# Patient Record
Sex: Female | Born: 1970 | Race: Black or African American | Hispanic: No | Smoking: Never smoker
Health system: Southern US, Community
[De-identification: ages and names within clinical notes are randomized; demographics above are authoritative.]

## PROBLEM LIST (undated history)

## (undated) DIAGNOSIS — N761 Subacute and chronic vaginitis: Secondary | ICD-10-CM

## (undated) DIAGNOSIS — E559 Vitamin D deficiency, unspecified: Secondary | ICD-10-CM

## (undated) DIAGNOSIS — E538 Deficiency of other specified B group vitamins: Secondary | ICD-10-CM

## (undated) DIAGNOSIS — L83 Acanthosis nigricans: Secondary | ICD-10-CM

## (undated) DIAGNOSIS — E669 Obesity, unspecified: Secondary | ICD-10-CM

## (undated) DIAGNOSIS — G35 Multiple sclerosis: Secondary | ICD-10-CM

## (undated) HISTORY — DX: Acanthosis nigricans: L83

## (undated) HISTORY — DX: Subacute and chronic vaginitis: N76.1

## (undated) HISTORY — DX: Deficiency of other specified B group vitamins: E53.8

## (undated) HISTORY — DX: Vitamin D deficiency, unspecified: E55.9

## (undated) HISTORY — DX: Obesity, unspecified: E66.9

## (undated) HISTORY — DX: Multiple sclerosis: G35

---

## 2007-10-10 ENCOUNTER — Emergency Department: Payer: Self-pay | Admitting: Emergency Medicine

## 2011-07-05 ENCOUNTER — Emergency Department: Payer: Self-pay | Admitting: Emergency Medicine

## 2013-07-02 ENCOUNTER — Ambulatory Visit: Payer: Self-pay | Admitting: Family Medicine

## 2013-08-28 ENCOUNTER — Ambulatory Visit: Payer: Self-pay | Admitting: Neurology

## 2013-08-28 LAB — HCG, QUANTITATIVE, PREGNANCY: Beta Hcg, Quant.: <1 m[IU]/mL — ABNORMAL LOW

## 2013-09-04 ENCOUNTER — Ambulatory Visit: Payer: Self-pay | Admitting: Neurology

## 2013-10-02 ENCOUNTER — Ambulatory Visit: Payer: Self-pay | Admitting: Neurology

## 2013-10-02 LAB — ALBUMIN: Albumin: 3.7 g/dL (ref 3.4–5.0)

## 2013-10-02 LAB — CSF CELL COUNT WITH DIFFERENTIAL
CSF Tube #: 1
CSF Tube #: 4
Lymphocytes: 89 %
Lymphocytes: 89 %
Monocytes/Macrophages: 11 %
Monocytes/Macrophages: 11 %
Neutrophils: 0 %
Neutrophils: 0 %
Other Cells: 0 %
Other Cells: 0 %
RBC (CSF): 0 /mm3
WBC (CSF): 5 /mm3
WBC (CSF): 8 /mm3

## 2013-10-02 LAB — APTT: Activated PTT: 26.4 secs (ref 23.6–35.9)

## 2013-10-02 LAB — PROTIME-INR
INR: 0.8
Prothrombin Time: 11.6 secs (ref 11.5–14.7)

## 2013-10-02 LAB — GLUCOSE, CSF: Glucose, CSF: 57 mg/dL (ref 40–75)

## 2013-10-02 LAB — PROTEIN, CSF: Protein, CSF: 52 mg/dL — ABNORMAL HIGH (ref 15–45)

## 2013-10-05 LAB — CSF CULTURE

## 2013-10-11 ENCOUNTER — Ambulatory Visit: Payer: Self-pay | Admitting: Neurology

## 2014-01-01 IMAGING — RF LUMBAR PUNCTURE FLUORO GUIDE
1 series · 2 of 2 positions shown · non-contrast
Comparison: none

REASON FOR EXAM: Neck Pain Numbness Tingling
COMMENTS:

PROCEDURE:     FL  - FL  GUIDED LUMBAR PUNCTURE  - October 02, 2013  [DATE]
RESULT:

[Series 1: fluoro_iodine 2fps_bw · 0.17mm/px · 2 of 2 frames shown]
[frame 1/2]
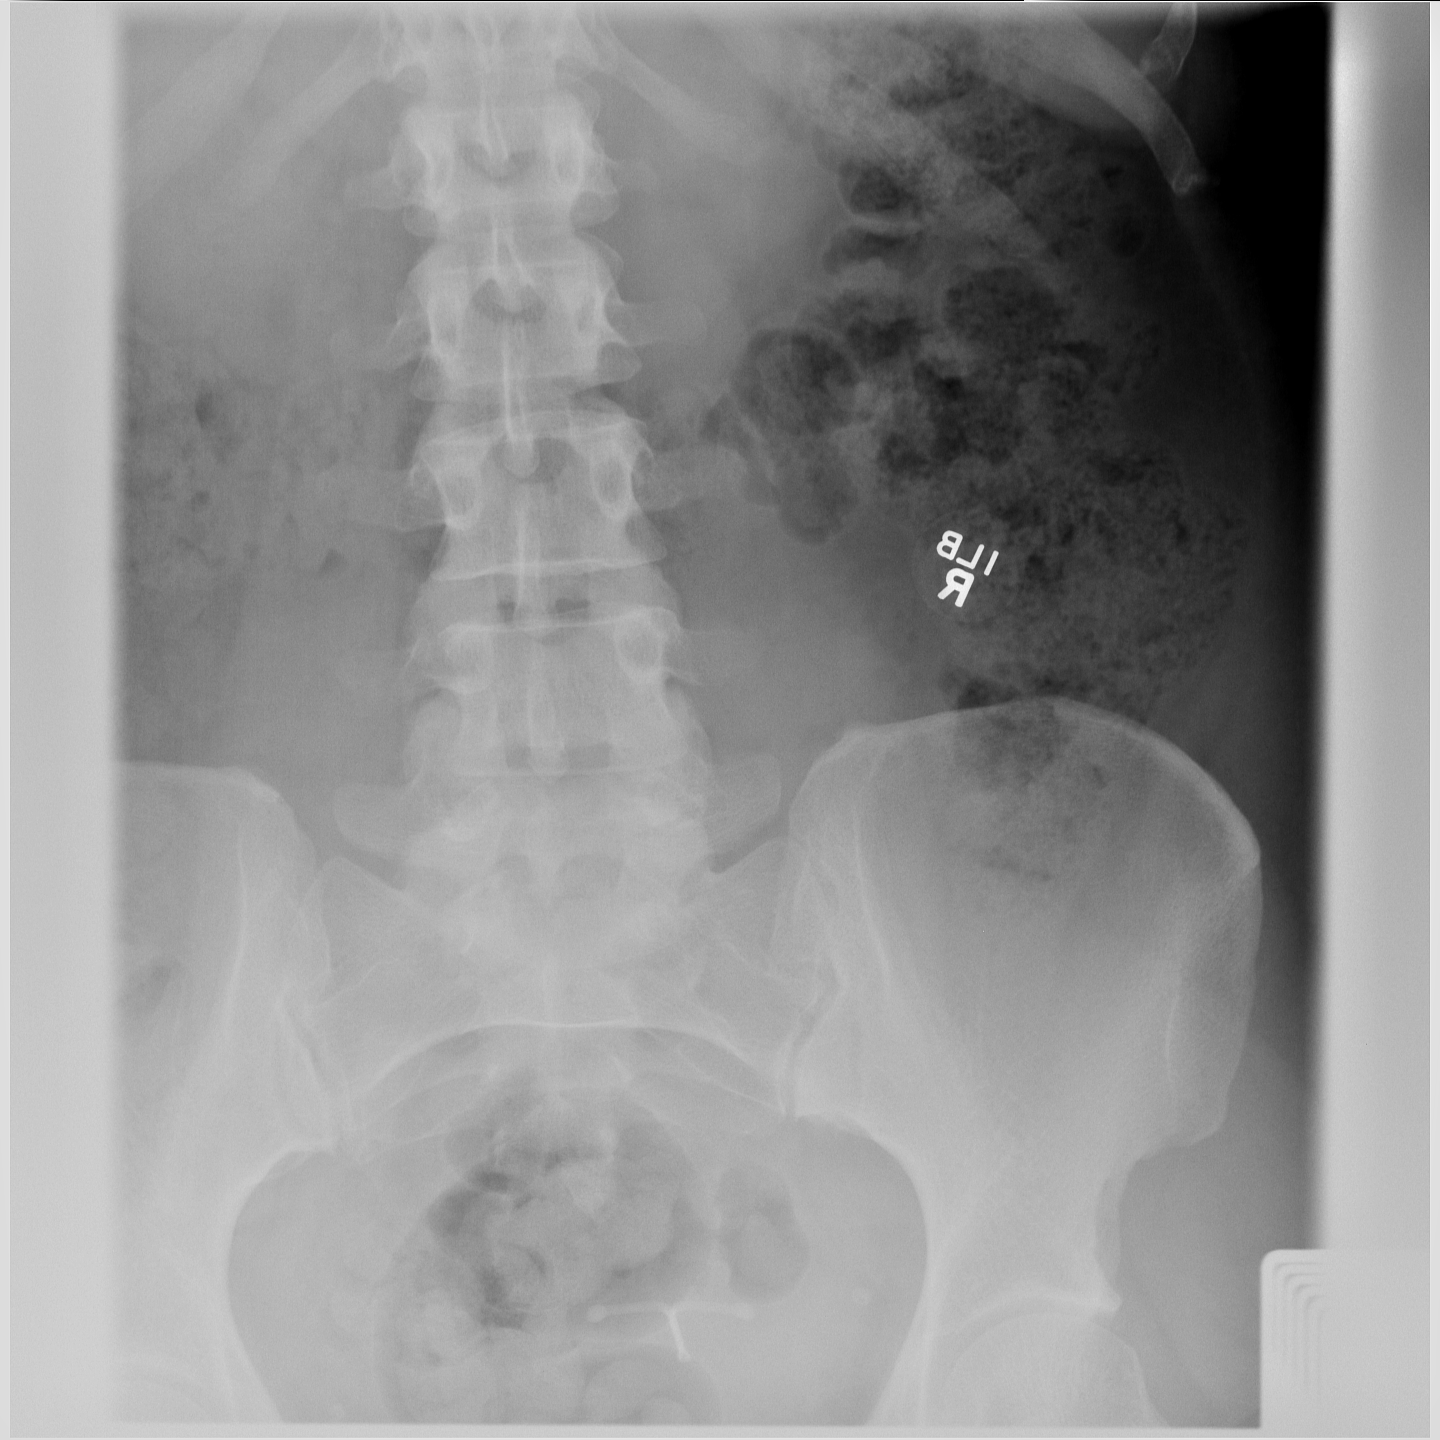
[frame 2/2]
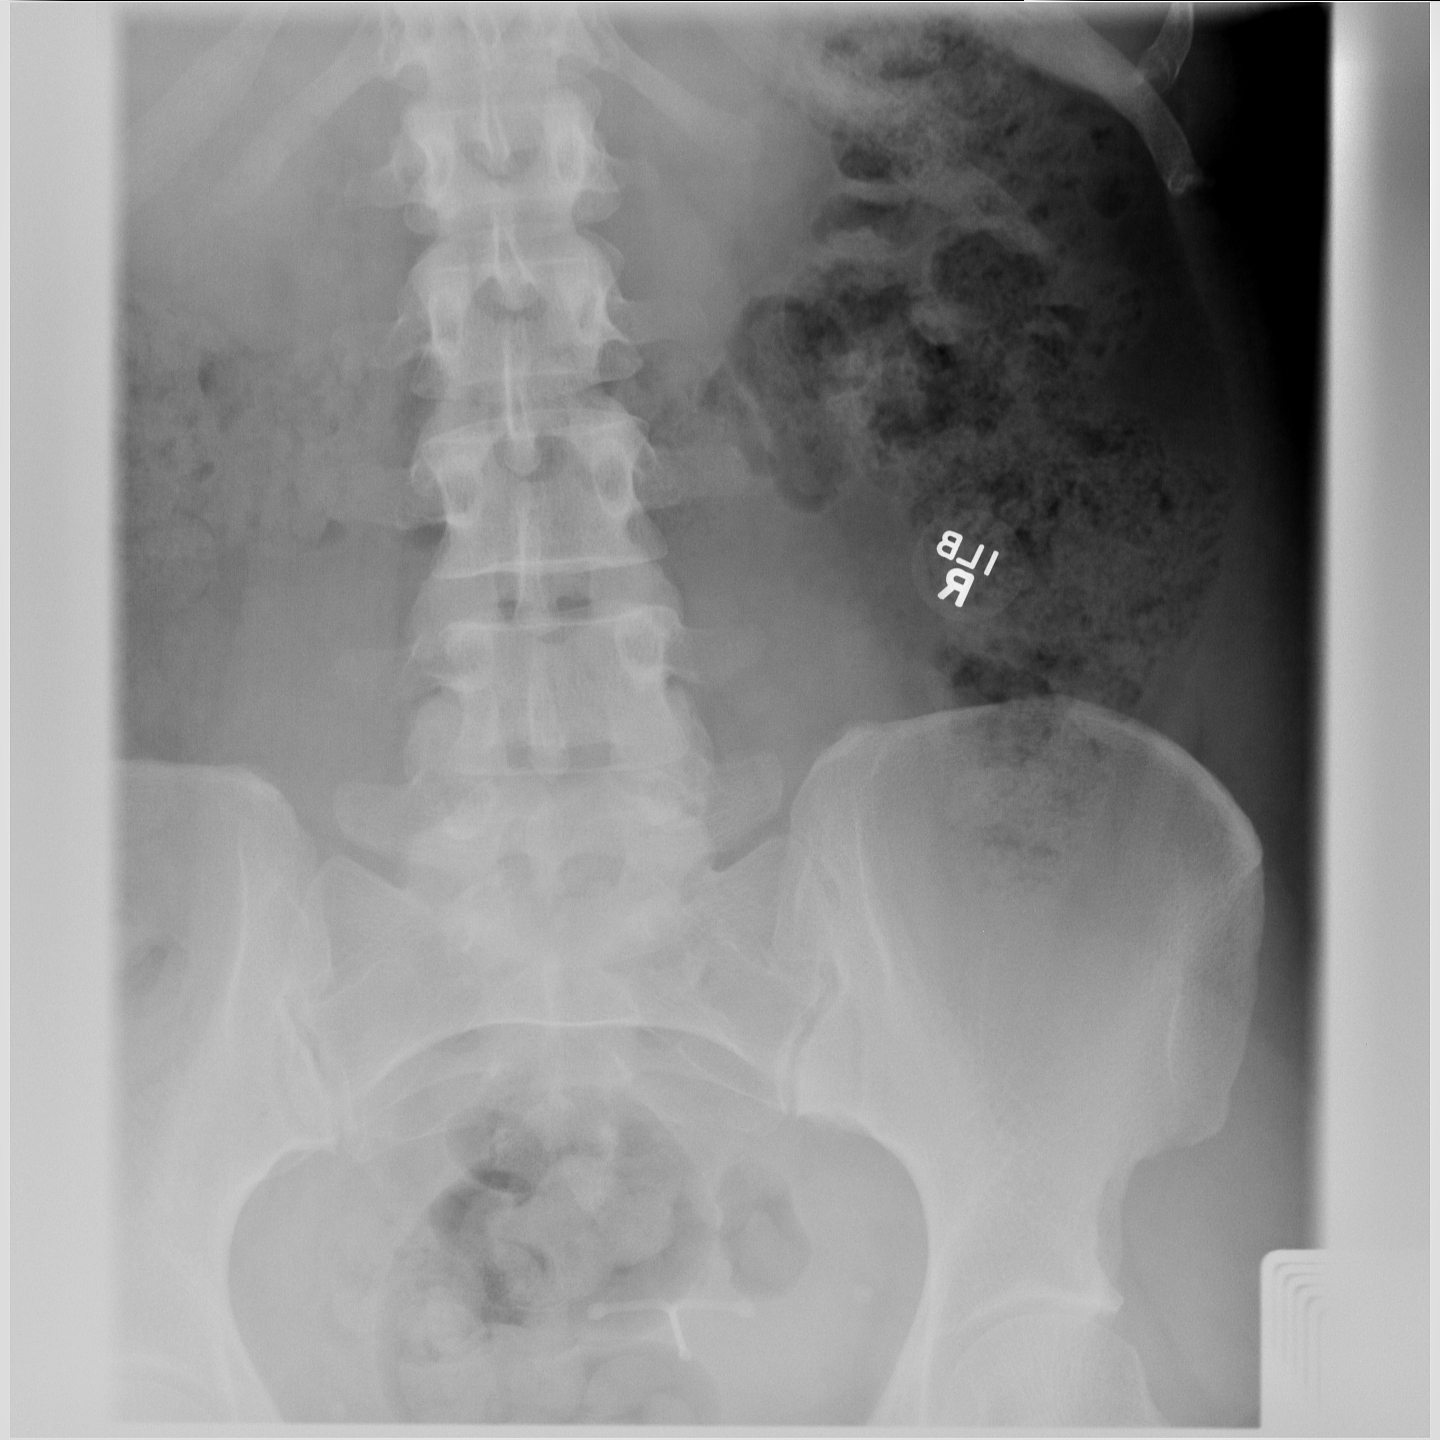

[2 of 2 positions shown; findings below may reference images not displayed]

FINDINGS: The patient was informed of the risks and benefits of the
procedure and proper informed consent was obtained. The patient was brought
to the fluoroscopy suite and placed in a prone position. The lumbar spine
was evaluated and proper entry site for thecal sac cannulation was
established at the L4-5 level. The overlying soft tissues were then prepped
and draped in the usual sterile fashion. Utilizing 4 ml of 1% Xylocaine
without epinephrine, the overlying soft tissues were anesthetized. Under
fluoroscopic guidance, the thecal sac was cannulated with a half-inch
inch/22 gauge spinal needle. Clear CSF is appreciated within the hub of the
needle. Approximately 10 ml of clear CSF was removed from the thecal sac.
The needle was removed and the patient tolerated the procedure without
complications.
IMPRESSION: Fluoroscopically guided lumbar puncture as described above.

## 2014-10-02 ENCOUNTER — Ambulatory Visit: Payer: Self-pay | Admitting: Family Medicine

## 2015-04-02 DIAGNOSIS — Z975 Presence of (intrauterine) contraceptive device: Secondary | ICD-10-CM | POA: Insufficient documentation

## 2015-06-03 ENCOUNTER — Encounter: Payer: Self-pay | Admitting: Family Medicine

## 2015-12-23 ENCOUNTER — Ambulatory Visit (INDEPENDENT_AMBULATORY_CARE_PROVIDER_SITE_OTHER): Payer: BLUE CROSS/BLUE SHIELD | Admitting: Family Medicine

## 2015-12-23 ENCOUNTER — Ambulatory Visit
Admission: RE | Admit: 2015-12-23 | Discharge: 2015-12-23 | Disposition: A | Discharge status: Home / Self Care | Payer: BLUE CROSS/BLUE SHIELD | Source: Ambulatory Visit | Attending: Family Medicine | Admitting: Family Medicine

## 2015-12-23 ENCOUNTER — Encounter: Payer: Self-pay | Admitting: Family Medicine

## 2015-12-23 VITALS — BP 114/62 | HR 86 | Temp 98.3°F | Resp 16 | Ht 65.0 in | Wt 212.0 lb

## 2015-12-23 DIAGNOSIS — J40 Bronchitis, not specified as acute or chronic: Secondary | ICD-10-CM | POA: Diagnosis not present

## 2015-12-23 DIAGNOSIS — R0981 Nasal congestion: Secondary | ICD-10-CM | POA: Insufficient documentation

## 2015-12-23 DIAGNOSIS — J209 Acute bronchitis, unspecified: Secondary | ICD-10-CM

## 2015-12-23 DIAGNOSIS — R05 Cough: Secondary | ICD-10-CM | POA: Diagnosis present

## 2015-12-23 DIAGNOSIS — E538 Deficiency of other specified B group vitamins: Secondary | ICD-10-CM | POA: Insufficient documentation

## 2015-12-23 DIAGNOSIS — J8410 Pulmonary fibrosis, unspecified: Secondary | ICD-10-CM | POA: Insufficient documentation

## 2015-12-23 MED ORDER — HYDROCOD POLST-CPM POLST ER 10-8 MG/5ML PO SUER: 5.0000 mL | Freq: Two times a day (BID) | ORAL | Status: DC | PRN

## 2015-12-23 MED ORDER — BENZONATATE 200 MG PO CAPS: 200.0000 mg | ORAL_CAPSULE | Freq: Three times a day (TID) | ORAL | Status: DC | PRN

## 2015-12-23 MED ORDER — AMOXICILLIN-POT CLAVULANATE 875-125 MG PO TABS: 1.0000 | ORAL_TABLET | Freq: Two times a day (BID) | ORAL | Status: DC

## 2015-12-23 NOTE — Patient Instructions (Signed)
1) Chest X-ray across the street at [place]  Acute Bronchitis Bronchitis is inflammation of the airways that extend from the windpipe into the lungs (bronchi). The inflammation often causes mucus to develop. This leads to a cough, which is the most common symptom of bronchitis.  In acute bronchitis, the condition usually develops suddenly and goes away over time, usually in a couple weeks. Smoking, allergies, and asthma can make bronchitis worse. Repeated episodes of bronchitis may cause further lung problems.  CAUSES Acute bronchitis is most often caused by the same virus that causes a cold. The virus can spread from person to person (contagious) through coughing, sneezing, and touching contaminated objects. SIGNS AND SYMPTOMS   Cough.   Fever.   Coughing up mucus.   Body aches.   Chest congestion.   Chills.   Shortness of breath.   Sore throat.  DIAGNOSIS  Acute bronchitis is usually diagnosed through a physical exam. Your health care provider will also ask you questions about your medical history. Tests, such as chest X-rays, are sometimes done to rule out other conditions.  TREATMENT  Acute bronchitis usually goes away in a couple weeks. Oftentimes, no medical treatment is necessary. Medicines are sometimes given for relief of fever or cough. Antibiotic medicines are usually not needed but may be prescribed in certain situations. In some cases, an inhaler may be recommended to help reduce shortness of breath and control the cough. A cool mist vaporizer may also be used to help thin bronchial secretions and make it easier to clear the chest.  HOME CARE INSTRUCTIONS  Get plenty of rest.   Drink enough fluids to keep your urine clear or pale yellow (unless you have a medical condition that requires fluid restriction). Increasing fluids may help thin your respiratory secretions (sputum) and reduce chest congestion, and it will prevent dehydration.   Take  medicines only as directed by your health care provider.  If you were prescribed an antibiotic medicine, finish it all even if you start to feel better.  Avoid smoking and secondhand smoke. Exposure to cigarette smoke or irritating chemicals will make bronchitis worse. If you are a smoker, consider using nicotine gum or skin patches to help control withdrawal symptoms. Quitting smoking will help your lungs heal faster.   Reduce the chances of another bout of acute bronchitis by washing your hands frequently, avoiding people with cold symptoms, and trying not to touch your hands to your mouth, nose, or eyes.   Keep all follow-up visits as directed by your health care provider.  SEEK MEDICAL CARE IF: Your symptoms do not improve after 1 week of treatment.  SEEK IMMEDIATE MEDICAL CARE IF:  You develop an increased fever or chills.   You have chest pain.   You have severe shortness of breath.  You have bloody sputum.   You develop dehydration.  You faint or repeatedly feel like you are going to pass out.  You develop repeated vomiting.  You develop a severe headache. MAKE SURE YOU:   Understand these instructions.  Will watch your condition.  Will get help right away if you are not doing well or get worse.   This information is not intended to replace advice given to you by your health care provider. Make sure you discuss any questions you have with your health care provider.   Document Released: 01/12/2005 Document Revised: 12/26/2014 Document Reviewed: 05/28/2013 Elsevier Interactive Patient Education [employer].

## 2015-12-23 NOTE — Progress Notes (Signed)
Name: Hannah Terry   MRN: [id number]    DOB: [birth year]   Date:12/23/2015       Progress Note  Subjective  Chief Complaint  Chief Complaint  Patient presents with  . URI    HPI  Patient is here today with concerns regarding the following symptoms sore throat, post nasal drip, sinus pressure and non productive cough which is causing a crick in her neck, chest tightness, hoarseness and headache that started 2 weeks ago. Not associated with fever. Has tried the following home remedies: Tylenol sinus and otc sinis meds. Positive sick contact her husband who was diagnosed with PNA.    Past Medical History  Diagnosis Date  . B12 deficiency     Social History  Substance Use Topics  . Smoking status: Never Smoker   . Smokeless tobacco: Not on file  . Alcohol Use: Not on file    No current outpatient prescriptions on file.  No Known Allergies  ROS  Positive for fatigue, nasal congestion, sinus pressure, ear fullness, cough as mentioned in HPI, otherwise all systems reviewed and are negative.  Objective  Filed Vitals:   12/23/15 1335  BP: 114/62  Pulse: 86  Temp: 98.3 F (36.8 C)  TempSrc: Oral  Resp: 16  Height: 5\' 5"  (1.651 m)  Weight: 212 lb (96.163 kg)  SpO2: 96%   Body mass index is 35.28 kg/(m^2).   Physical Exam  Constitutional: Patient appears well-developed and well-nourished. In no acute distress but does appear to be fatigued from acute illness. HEENT:  - Head: Normocephalic and atraumatic.  - Ears: RIGHT TM bulging with minimal clear exudate, LEFT TM bulging with minimal clear exudate.  - Nose: Nasal mucosa boggy and congested.  - Mouth/Throat: Oropharynx is moist with slight erythema of bilateral tonsils without hypertrophy or exudates. Post nasal drainage present.  - Eyes: Conjunctivae clear, EOM movements normal. PERRLA. No scleral icterus.  Neck: Normal range of motion. Neck supple. No JVD present. No thyromegaly present. No local  lymphadenopathy. Cardiovascular: Regular rate, regular rhythm with no murmurs heard.  Pulmonary/Chest: Effort normal and breath sounds clear in all lung fields.  Musculoskeletal: Normal range of motion bilateral UE and LE, no joint effusions. Skin: Skin is warm and dry. No rash noted. Psychiatric: Patient has a normal mood and affect. Behavior is normal in office today. Judgment and thought content normal in office today.   Assessment & Plan  1. Bronchitis with bronchospasm Etiologies include viral infection progressing to superimposed bacterial infection. Instructed patient on increasing hydration, nasal saline spray, steam inhalation, NSAID if tolerated and not contraindicated. Will get CXR to rule out PNA.   - amoxicillin-clavulanate (AUGMENTIN) 875-125 MG tablet; Take 1 tablet by mouth 2 (two) times daily.  Dispense: 20 tablet; Refill: 0 - chlorpheniramine-HYDROcodone (TUSSIONEX PENNKINETIC ER) 10-8 MG/5ML SUER; Take 5 mLs by mouth every 12 (twelve) hours as needed.  Dispense: 115 mL; Refill: 0 - benzonatate (TESSALON) 200 MG capsule; Take 1 capsule (200 mg total) by mouth 3 (three) times daily as needed for cough.  Dispense: 30 capsule; Refill: 0 - DG Chest 2 View; Future  2. Sinus congestion See AP #1 - amoxicillin-clavulanate (AUGMENTIN) 875-125 MG tablet; Take 1 tablet by mouth 2 (two) times daily.  Dispense: 20 tablet; Refill: 0 - chlorpheniramine-HYDROcodone (TUSSIONEX PENNKINETIC ER) 10-8 MG/5ML SUER; Take 5 mLs by mouth every 12 (twelve) hours as needed.  Dispense: 115 mL; Refill: 0 - benzonatate (TESSALON) 200 MG capsule; Take 1 capsule (200 mg total)  by mouth 3 (three) times daily as needed for cough.  Dispense: 30 capsule; Refill: 0 - DG Chest 2 View; Future

## 2015-12-25 ENCOUNTER — Ambulatory Visit: Payer: Self-pay | Admitting: Family Medicine

## 2016-01-12 ENCOUNTER — Ambulatory Visit (INDEPENDENT_AMBULATORY_CARE_PROVIDER_SITE_OTHER): Payer: BLUE CROSS/BLUE SHIELD | Admitting: Family Medicine

## 2016-01-12 ENCOUNTER — Encounter: Payer: Self-pay | Admitting: Family Medicine

## 2016-01-12 VITALS — BP 106/66 | HR 86 | Temp 98.0°F | Resp 18 | Ht 65.0 in | Wt 208.4 lb

## 2016-01-12 DIAGNOSIS — R9389 Abnormal findings on diagnostic imaging of other specified body structures: Secondary | ICD-10-CM | POA: Insufficient documentation

## 2016-01-12 DIAGNOSIS — G35 Multiple sclerosis: Secondary | ICD-10-CM | POA: Diagnosis not present

## 2016-01-12 DIAGNOSIS — Z23 Encounter for immunization: Secondary | ICD-10-CM | POA: Diagnosis not present

## 2016-01-12 DIAGNOSIS — R0789 Other chest pain: Secondary | ICD-10-CM | POA: Diagnosis not present

## 2016-01-12 DIAGNOSIS — E669 Obesity, unspecified: Secondary | ICD-10-CM | POA: Insufficient documentation

## 2016-01-12 DIAGNOSIS — R0981 Nasal congestion: Secondary | ICD-10-CM | POA: Diagnosis not present

## 2016-01-12 DIAGNOSIS — R938 Abnormal findings on diagnostic imaging of other specified body structures: Secondary | ICD-10-CM

## 2016-01-12 DIAGNOSIS — J40 Bronchitis, not specified as acute or chronic: Secondary | ICD-10-CM | POA: Diagnosis not present

## 2016-01-12 DIAGNOSIS — J209 Acute bronchitis, unspecified: Secondary | ICD-10-CM

## 2016-01-12 MED ORDER — FLUTICASONE PROPIONATE 50 MCG/ACT NA SUSP: 2.0000 | Freq: Every day | NASAL | Status: DC

## 2016-01-12 MED ORDER — FLUTICASONE FUROATE-VILANTEROL 100-25 MCG/INH IN AEPB: 1.0000 | INHALATION_SPRAY | Freq: Every day | RESPIRATORY_TRACT | Status: DC

## 2016-01-12 MED ORDER — METAXALONE 800 MG PO TABS: 800.0000 mg | ORAL_TABLET | Freq: Three times a day (TID) | ORAL | Status: DC

## 2016-01-12 NOTE — Progress Notes (Signed)
Name: Hannah Terry   MRN: [id number]    DOB: Jul 19, 1971   Date:01/12/2016       Progress Note  Subjective  Chief Complaint  Chief Complaint  Patient presents with  . Follow-up    3 week F/U  . Bronchitis    Patient states she is still experiencing symptoms such as dry cough, tightness in chest, left shoulder pain, sinus drainage, wheezing, sob. Medication for cough has helped her rest at night but states she feels like she still has bronchitis.     HPI  Bronchitis: seen by Dr. Nadine Counts a few weeks ago but she is back today because her left chest wall hurts when she coughs, she still has some wheezing and post-nasal drainage. She still has a little of the cough medication and is taking at night. CXR reviewed and showed a calcified granuloma. She states she is feeling slightly better, but tired of cough and post-nasal drainage. Appetite is back to normal, no nausea, no fever.   MS: diagnosed at [facility], supposed to be on Gabapentin and Tecfidera but she has not been compliant, she still has numbness and tingling on both of her hands. No weakness, she is still working full time.    Patient Active Problem List   Diagnosis Date Noted  . Adiposity 01/12/2016  . MS (multiple sclerosis) ([place]) 01/12/2016  . Abnormal CXR 01/12/2016  . B12 deficiency 12/23/2015  . Bronchitis with bronchospasm 12/23/2015  . IUD contraception 04/02/2015    No past surgical history on file.  Family History  Problem Relation Age of Onset  . Alcohol abuse Mother   . Alcohol abuse Father   . Drug abuse Father     Social History   Social History  . Marital Status: Married    Spouse Name: N/A  . Number of Children: N/A  . Years of Education: N/A   Occupational History  . Not on file.   Social History Main Topics  . Smoking status: Never Smoker   . Smokeless tobacco: Never Used  . Alcohol Use: No  . Drug Use: No  . Sexual Activity: Yes   Other Topics Concern  . Not on file   Social History  Narrative     Current outpatient prescriptions:  .  Dimethyl Fumarate (TECFIDERA) 240 MG CPDR, Take by mouth., Disp: , Rfl:  .  gabapentin (NEURONTIN) 800 MG tablet, Take by mouth., Disp: , Rfl:  .  levonorgestrel (MIRENA, 52 MG,) 20 MCG/24HR IUD, by Intrauterine route., Disp: , Rfl:  .  Fluticasone Furoate-Vilanterol (BREO ELLIPTA) 100-25 MCG/INH AEPB, Inhale 1 puff into the lungs daily., Disp: 60 each, Rfl: 0 .  metaxalone (SKELAXIN) 800 MG tablet, Take 1 tablet (800 mg total) by mouth 3 (three) times daily., Disp: 30 tablet, Rfl: 0  No Known Allergies   ROS  Ten systems reviewed and is negative except as mentioned in HPI   Objective  Filed Vitals:   01/12/16 1347  BP: 106/66  Pulse: 86  Temp: 98 F (36.7 C)  TempSrc: Oral  Resp: 18  Height: 5\' 5"  (1.651 m)  Weight: 208 lb 6.4 oz (94.53 kg)  SpO2: 98%    Body mass index is 34.68 kg/(m^2).  Physical Exam  Constitutional: Patient appears well-developed and well-nourished. Obese No distress.  HEENT: head atraumatic, normocephalic, pupils equal and reactive to light,  neck supple, throat within normal limits, no postnasal drainage during exam Cardiovascular: Normal rate, regular rhythm and normal heart sounds.  No murmur  heard. No BLE edema. Pulmonary/Chest: Effort normal and breath sounds normal. No respiratory distress. She has tenderness during palpation of left anterior chest wall Abdominal: Soft.  There is no tenderness. Psychiatric: Patient has a normal mood and affect. behavior is normal. Judgment and thought content normal.  PHQ2/9: Depression screen PHQ 2/9 12/23/2015  Decreased Interest 0  Down, Depressed, Hopeless 0  PHQ - 2 Score 0    Fall Risk: Fall Risk  12/23/2015  Falls in the past year? No    Assessment & Plan  1. Chest wall pain  - metaxalone (SKELAXIN) 800 MG tablet; Take 1 tablet (800 mg total) by mouth 3 (three) times daily.  Dispense: 30 tablet; Refill: 0  2. MS (multiple sclerosis)  ([place])  She needs to resume medications and keep follow up with neurologist  3. Needs flu shot  - Flu Vaccine QUAD 36+ mos PF IM (Fluarix & Fluzone Quad PF)  4. Abnormal CXR  Granuloma on CXR, discussed with patient   5. Bronchitis with bronchospasm  - Fluticasone Furoate-Vilanterol (BREO ELLIPTA) 100-25 MCG/INH AEPB; Inhale 1 puff into the lungs daily.  Dispense: 60 each; Refill: 0  6. Nasal congestion  - fluticasone (FLONASE) 50 MCG/ACT nasal spray; Place 2 sprays into both nostrils daily.  Dispense: 16 g; Refill: 0

## 2016-01-18 ENCOUNTER — Telehealth: Payer: Self-pay | Admitting: Family Medicine

## 2016-01-18 DIAGNOSIS — R0981 Nasal congestion: Secondary | ICD-10-CM

## 2016-01-18 MED ORDER — FLUTICASONE PROPIONATE 50 MCG/ACT NA SUSP: 2.0000 | Freq: Every day | NASAL | Status: DC

## 2016-01-18 NOTE — Telephone Encounter (Signed)
Pt states she dropped the bottle of her nasel spay and it broke and wants to know if she can get another refill sent to [place].

## 2016-01-18 NOTE — Telephone Encounter (Signed)
Sent to her pharmacy, but insurance may not pay for it.

## 2016-01-19 NOTE — Telephone Encounter (Signed)
Pt.notified

## 2016-02-18 ENCOUNTER — Other Ambulatory Visit: Payer: Self-pay | Admitting: Family Medicine

## 2016-02-22 ENCOUNTER — Ambulatory Visit (INDEPENDENT_AMBULATORY_CARE_PROVIDER_SITE_OTHER): Payer: BLUE CROSS/BLUE SHIELD | Admitting: Family Medicine

## 2016-02-22 ENCOUNTER — Encounter: Payer: Self-pay | Admitting: Family Medicine

## 2016-02-22 VITALS — BP 118/62 | HR 101 | Temp 98.1°F | Resp 18 | Ht 65.0 in | Wt 211.9 lb

## 2016-02-22 DIAGNOSIS — Z0000 Encounter for general adult medical examination without abnormal findings: Secondary | ICD-10-CM

## 2016-02-22 DIAGNOSIS — Z975 Presence of (intrauterine) contraceptive device: Secondary | ICD-10-CM | POA: Diagnosis not present

## 2016-02-22 DIAGNOSIS — J302 Other seasonal allergic rhinitis: Secondary | ICD-10-CM | POA: Diagnosis not present

## 2016-02-22 DIAGNOSIS — Z1239 Encounter for other screening for malignant neoplasm of breast: Secondary | ICD-10-CM | POA: Diagnosis not present

## 2016-02-22 DIAGNOSIS — Z01419 Encounter for gynecological examination (general) (routine) without abnormal findings: Secondary | ICD-10-CM

## 2016-02-22 DIAGNOSIS — L689 Hypertrichosis, unspecified: Secondary | ICD-10-CM | POA: Insufficient documentation

## 2016-02-22 DIAGNOSIS — Z124 Encounter for screening for malignant neoplasm of cervix: Secondary | ICD-10-CM | POA: Diagnosis not present

## 2016-02-22 DIAGNOSIS — L83 Acanthosis nigricans: Secondary | ICD-10-CM | POA: Diagnosis not present

## 2016-02-22 DIAGNOSIS — R058 Other specified cough: Secondary | ICD-10-CM

## 2016-02-22 DIAGNOSIS — Z13220 Encounter for screening for lipoid disorders: Secondary | ICD-10-CM | POA: Diagnosis not present

## 2016-02-22 DIAGNOSIS — R05 Cough: Secondary | ICD-10-CM

## 2016-02-22 MED ORDER — LEVOCETIRIZINE DIHYDROCHLORIDE 5 MG PO TABS: 5.0000 mg | ORAL_TABLET | Freq: Every evening | ORAL | Status: DC

## 2016-02-22 MED ORDER — AZELASTINE-FLUTICASONE 137-50 MCG/ACT NA SUSP: 1.0000 | Freq: Two times a day (BID) | NASAL | Status: DC

## 2016-02-22 MED ORDER — ALBUTEROL SULFATE HFA 108 (90 BASE) MCG/ACT IN AERS: 2.0000 | INHALATION_SPRAY | Freq: Four times a day (QID) | RESPIRATORY_TRACT | Status: DC | PRN

## 2016-02-22 MED ORDER — MONTELUKAST SODIUM 10 MG PO TABS: 10.0000 mg | ORAL_TABLET | Freq: Every day | ORAL | Status: DC

## 2016-02-22 NOTE — Addendum Note (Signed)
Addended by: Steele Sizer F on: 02/22/2016 03:15 PM   Modules accepted: Miquel Dunn

## 2016-02-22 NOTE — Progress Notes (Signed)
Name: Hannah Terry   MRN: [id number]    DOB: [birth year]   Date:02/22/2016       Progress Note  Subjective  Chief Complaint  Chief Complaint  Patient presents with  . Annual Exam    Patient would like a refill of Breo and Flonase    HPI  Well Woman: she is only bothered by allergies and cough at this time, otherwise feeling well. She has mild vaginal discharge, no odor,no pelvic pain. She is due for pap smear and mammogram. No bladder concerns.   AR/Cough: she was treated for bronchitis about 6 weeks ago, she states she continues to have post-nasal drainage, post-nasal drainage. She has been using Flonase and it seems to improve symptoms. She has itchy eyes but not watery. Her cough is dry, and has difficulty getting her air out. She is not a smoker, no previous history of asthma. She states Breo helped with symptoms.    Patient Active Problem List   Diagnosis Date Noted  . Obesity (BMI 30.0-34.9) 01/12/2016  . MS (multiple sclerosis) ([place]) 01/12/2016  . Abnormal CXR 01/12/2016  . B12 deficiency 12/23/2015  . IUD contraception 04/02/2015    History reviewed. No pertinent past surgical history.  Family History  Problem Relation Age of Onset  . Alcohol abuse Mother   . Alcohol abuse Father   . Drug abuse Father     Social History   Social History  . Marital Status: Married    Spouse Name: N/A  . Number of Children: N/A  . Years of Education: N/A   Occupational History  . Not on file.   Social History Main Topics  . Smoking status: Never Smoker   . Smokeless tobacco: Never Used  . Alcohol Use: No  . Drug Use: No  . Sexual Activity: Yes   Other Topics Concern  . Not on file   Social History Narrative     Current outpatient prescriptions:  .  Dimethyl Fumarate (TECFIDERA) 240 MG CPDR, Take by mouth., Disp: , Rfl:  .  gabapentin (NEURONTIN) 800 MG tablet, Take by mouth., Disp: , Rfl:  .  Vitamin D, Ergocalciferol, (DRISDOL) 50000 units CAPS capsule, Take by  mouth., Disp: , Rfl:  .  albuterol (PROVENTIL HFA;VENTOLIN HFA) 108 (90 Base) MCG/ACT inhaler, Inhale 2 puffs into the lungs every 6 (six) hours as needed for wheezing or shortness of breath., Disp: 1 Inhaler, Rfl: 0 .  Azelastine-Fluticasone (DYMISTA) 137-50 MCG/ACT SUSP, Place 1 spray into the nose 2 (two) times daily., Disp: 23 g, Rfl: 2 .  levocetirizine (XYZAL) 5 MG tablet, Take 1 tablet (5 mg total) by mouth every evening., Disp: 30 tablet, Rfl: 2 .  levonorgestrel (MIRENA, 52 MG,) 20 MCG/24HR IUD, 1 each by Intrauterine route., Disp: , Rfl:  .  montelukast (SINGULAIR) 10 MG tablet, Take 1 tablet (10 mg total) by mouth at bedtime., Disp: 30 tablet, Rfl: 3  No Known Allergies   ROS  Constitutional: Negative for fever or weight change.  Respiratory: Positive  for cough and shortness of breath.   Cardiovascular: Negative for chest pain or palpitations.  Gastrointestinal: Negative for abdominal pain, no bowel changes.  Musculoskeletal: Negative for gait problem or joint swelling.  Skin: Negative for rash.  Neurological: Negative for dizziness or headache. Chronic paresthesia from MS No other specific complaints in a complete review of systems (except as listed in HPI above).  Objective  Filed Vitals:   02/22/16 1425  BP: 118/62  Pulse: 101  Temp: 98.1 F (36.7 C)  TempSrc: Oral  Resp: 18  Height: 5\' 5"  (1.651 m)  Weight: 211 lb 14.4 oz (96.117 kg)  SpO2: 97%    Body mass index is 35.26 kg/(m^2).  Physical Exam  Constitutional: Patient appears well-developed and well-nourished. No distress.  HENT: Head: Normocephalic and atraumatic. Ears: B TMs ok, no erythema or effusion; Nose: Nose normal. Mouth/Throat: Oropharynx is clear and moist. No oropharyngeal exudate.  Eyes: Conjunctivae and EOM are normal. Pupils are equal, round, and reactive to light. No scleral icterus.  Neck: Normal range of motion. Neck supple. No JVD present. No thyromegaly present.  Cardiovascular:  Normal rate, regular rhythm and normal heart sounds.  No murmur heard. No BLE edema. Pulmonary/Chest: Effort normal and breath sounds normal. No respiratory distress. Abdominal: Soft. Bowel sounds are normal, no distension. There is no tenderness. no masses Breast: no lumps or masses, no nipple discharge or rashes FEMALE GENITALIA:  External genitalia normal External urethra normal Vaginal vault normal without discharge or lesions Cervix normal without discharge or lesions, IUD strings not visualized.  Bimanual exam normal without masses RECTAL: not done Musculoskeletal: Normal range of motion, no joint effusions. No gross deformities Neurological: he is alert and oriented to person, place, and time. No cranial nerve deficit. Coordination, balance, strength, speech and gait are normal.  Skin: Skin is warm and dry. Acanthosis nigricans on her neck. No erythema.  Psychiatric: Patient has a normal mood and affect. behavior is normal. Judgment and thought content normal.  PHQ2/9: Depression screen [facility] 2/9 02/22/2016 12/23/2015  Decreased Interest 0 0  Down, Depressed, Hopeless 0 0  PHQ - 2 Score 0 0    Fall Risk: Fall Risk  02/22/2016 12/23/2015  Falls in the past year? No No    Functional Status Survey: Is the patient deaf or have difficulty hearing?: No Does the patient have difficulty seeing, even when wearing glasses/contacts?: No Does the patient have difficulty concentrating, remembering, or making decisions?: No Does the patient have difficulty walking or climbing stairs?: No Does the patient have difficulty dressing or bathing?: No Does the patient have difficulty doing errands alone such as visiting a doctor's office or shopping?: No    Assessment & Plan  1. Well woman exam  Discussed importance of 150 minutes of physical activity weekly, eat two servings of fish weekly, eat one serving of tree nuts ( cashews, pistachios, pecans, almonds.Marland Kitchen) every other day, eat 6 servings of  fruit/vegetables daily and drink plenty of water and avoid sweet beverages.   2. Other seasonal allergic rhinitis  - montelukast (SINGULAIR) 10 MG tablet; Take 1 tablet (10 mg total) by mouth at bedtime.  Dispense: 30 tablet; Refill: 3 - albuterol (PROVENTIL HFA;VENTOLIN HFA) 108 (90 Base) MCG/ACT inhaler; Inhale 2 puffs into the lungs every 6 (six) hours as needed for wheezing or shortness of breath.  Dispense: 1 Inhaler; Refill: 0 - levocetirizine (XYZAL) 5 MG tablet; Take 1 tablet (5 mg total) by mouth every evening.  Dispense: 30 tablet; Refill: 2 - Azelastine-Fluticasone (DYMISTA) 137-50 MCG/ACT SUSP; Place 1 spray into the nose 2 (two) times daily.  Dispense: 23 g; Refill: 2  3. Dry cough  - montelukast (SINGULAIR) 10 MG tablet; Take 1 tablet (10 mg total) by mouth at bedtime.  Dispense: 30 tablet; Refill: 3  4. Cervical cancer screening  - Pap IG, CT/NG NAA, and HPV (high risk)  5. Breast cancer screening  - MM Digital Screening; Future  6. Lipid screening  -  Lipid panel   7. IUD contraception  - US Pelvis Limited; Future  8. Acanthosis nigricans  - Hemoglobin A1c  9. Hypertrichosis

## 2016-02-24 ENCOUNTER — Telehealth: Payer: Self-pay

## 2016-02-24 ENCOUNTER — Emergency Department
Admission: EM | Admit: 2016-02-24 | Discharge: 2016-02-24 | Disposition: A | Discharge status: Home / Self Care | Payer: BLUE CROSS/BLUE SHIELD | Attending: Student | Admitting: Student

## 2016-02-24 DIAGNOSIS — S161XXA Strain of muscle, fascia and tendon at neck level, initial encounter: Secondary | ICD-10-CM

## 2016-02-24 DIAGNOSIS — Y998 Other external cause status: Secondary | ICD-10-CM | POA: Insufficient documentation

## 2016-02-24 DIAGNOSIS — H6982 Other specified disorders of Eustachian tube, left ear: Secondary | ICD-10-CM

## 2016-02-24 DIAGNOSIS — Y9389 Activity, other specified: Secondary | ICD-10-CM | POA: Diagnosis not present

## 2016-02-24 DIAGNOSIS — Y92410 Unspecified street and highway as the place of occurrence of the external cause: Secondary | ICD-10-CM | POA: Diagnosis not present

## 2016-02-24 DIAGNOSIS — S199XXA Unspecified injury of neck, initial encounter: Secondary | ICD-10-CM | POA: Diagnosis present

## 2016-02-24 DIAGNOSIS — Z79899 Other long term (current) drug therapy: Secondary | ICD-10-CM | POA: Diagnosis not present

## 2016-02-24 DIAGNOSIS — H109 Unspecified conjunctivitis: Secondary | ICD-10-CM | POA: Diagnosis not present

## 2016-02-24 DIAGNOSIS — H6992 Unspecified Eustachian tube disorder, left ear: Secondary | ICD-10-CM | POA: Diagnosis not present

## 2016-02-24 MED ORDER — NAPHAZOLINE HCL 0.1 % OP SOLN: 1.0000 [drp] | Freq: Four times a day (QID) | OPHTHALMIC | Status: DC | PRN

## 2016-02-24 MED ORDER — METHOCARBAMOL 750 MG PO TABS: 750.0000 mg | ORAL_TABLET | Freq: Four times a day (QID) | ORAL | Status: DC

## 2016-02-24 MED ORDER — PSEUDOEPHEDRINE HCL ER 120 MG PO TB12: 120.0000 mg | ORAL_TABLET | Freq: Two times a day (BID) | ORAL | Status: DC | PRN

## 2016-02-24 MED ORDER — IBUPROFEN 800 MG PO TABS: 800.0000 mg | ORAL_TABLET | Freq: Three times a day (TID) | ORAL | Status: DC | PRN
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
## 2016-02-24 NOTE — Discharge Instructions (Signed)
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
## 2016-02-26 LAB — PAP IG, CT-NG NAA, HPV HIGH-RISK
Chlamydia, Nuc. Acid Amp: NEGATIVE
Gonococcus by Nucleic Acid Amp: NEGATIVE
HPV, HIGH-RISK: NEGATIVE
PAP SMEAR COMMENT: 0

## 2016-03-01 ENCOUNTER — Other Ambulatory Visit: Payer: Self-pay | Admitting: Family Medicine

## 2016-03-01 DIAGNOSIS — Z975 Presence of (intrauterine) contraceptive device: Principal | ICD-10-CM

## 2016-03-01 DIAGNOSIS — Z538 Procedure and treatment not carried out for other reasons: Secondary | ICD-10-CM

## 2016-03-04 ENCOUNTER — Ambulatory Visit: Payer: BLUE CROSS/BLUE SHIELD

## 2016-03-24 ENCOUNTER — Ambulatory Visit: Payer: BLUE CROSS/BLUE SHIELD | Admitting: Family Medicine

## 2016-03-31 DIAGNOSIS — Z30431 Encounter for routine checking of intrauterine contraceptive device: Secondary | ICD-10-CM | POA: Insufficient documentation

## 2016-06-09 ENCOUNTER — Telehealth: Payer: Self-pay | Admitting: Family Medicine

## 2016-06-09 NOTE — Telephone Encounter (Signed)
NURES Hannah Terry FROM [facility]rod [phone] SAYS SHE HAS ENROLLED CASE MANAGEMENT FOR  HER IN EDUCATION SUPPORT. NO CALL NEEDED BACK. JUST FY AND ONLY CALL BACK IF ANY QUESTIONS

## 2016-08-26 ENCOUNTER — Ambulatory Visit: Payer: BLUE CROSS/BLUE SHIELD | Attending: Neurology

## 2016-08-26 DIAGNOSIS — R0683 Snoring: Secondary | ICD-10-CM | POA: Diagnosis present

## 2016-08-26 DIAGNOSIS — G4733 Obstructive sleep apnea (adult) (pediatric): Secondary | ICD-10-CM | POA: Diagnosis not present

## 2017-01-24 ENCOUNTER — Ambulatory Visit: Payer: BLUE CROSS/BLUE SHIELD | Admitting: Family Medicine

## 2017-02-07 ENCOUNTER — Encounter: Payer: Self-pay | Admitting: Family Medicine

## 2017-02-07 ENCOUNTER — Ambulatory Visit (INDEPENDENT_AMBULATORY_CARE_PROVIDER_SITE_OTHER): Payer: BLUE CROSS/BLUE SHIELD | Admitting: Family Medicine

## 2017-02-07 VITALS — BP 114/76 | HR 96 | Temp 98.6°F | Resp 16 | Ht 65.0 in | Wt 213.8 lb

## 2017-02-07 DIAGNOSIS — E669 Obesity, unspecified: Secondary | ICD-10-CM | POA: Diagnosis not present

## 2017-02-07 DIAGNOSIS — R05 Cough: Secondary | ICD-10-CM

## 2017-02-07 DIAGNOSIS — E538 Deficiency of other specified B group vitamins: Secondary | ICD-10-CM | POA: Diagnosis not present

## 2017-02-07 DIAGNOSIS — J302 Other seasonal allergic rhinitis: Secondary | ICD-10-CM | POA: Diagnosis not present

## 2017-02-07 DIAGNOSIS — G35 Multiple sclerosis: Secondary | ICD-10-CM | POA: Diagnosis not present

## 2017-02-07 DIAGNOSIS — E559 Vitamin D deficiency, unspecified: Secondary | ICD-10-CM

## 2017-02-07 DIAGNOSIS — R058 Other specified cough: Secondary | ICD-10-CM

## 2017-02-07 DIAGNOSIS — R0602 Shortness of breath: Secondary | ICD-10-CM | POA: Diagnosis not present

## 2017-02-07 DIAGNOSIS — L83 Acanthosis nigricans: Secondary | ICD-10-CM

## 2017-02-07 MED ORDER — AZELASTINE-FLUTICASONE 137-50 MCG/ACT NA SUSP: 1.0000 | Freq: Two times a day (BID) | NASAL | 2 refills | Status: DC

## 2017-02-07 MED ORDER — LEVOCETIRIZINE DIHYDROCHLORIDE 5 MG PO TABS: 5.0000 mg | ORAL_TABLET | Freq: Every evening | ORAL | 2 refills | Status: AC

## 2017-02-07 MED ORDER — CYANOCOBALAMIN 1000 MCG/ML IJ SOLN
1000.0000 ug | Freq: Once | INTRAMUSCULAR | Status: AC
  Administered 2017-02-07: 1000 ug via INTRAMUSCULAR

## 2017-02-07 MED ORDER — MONTELUKAST SODIUM 10 MG PO TABS: 10.0000 mg | ORAL_TABLET | Freq: Every day | ORAL | 3 refills | Status: DC

## 2017-02-07 NOTE — Progress Notes (Signed)
Name: Hannah Terry   MRN: [id number]    DOB: 1971-10-14   Date:02/07/2017       Progress Note  Subjective  Chief Complaint  Chief Complaint  Patient presents with  . Obesity    Has gained 3 more pounds since last visit, patient would like to discuss medication. Very active at work, walks 2-3 x weekly and eating healthier.   . Shortness of Breath    Would like refill of Inhaler, she states she is more short winded than usually    HPI  AR: she has been out of her medication, she has post-nasal drainage, nasal congestion, no sneezing, but she also has a dry cough and also intermittent wheezing and recently noticing SOB with activity. No orthopnea or lower extremity edema  Vitamin D deficiency and B12: labs done in 12/2016 by Dr. Manuella Ghazi, she is taking oral supplementation but willing to get B12 injections  Obesity: she wants to lose weight, she has features on insulin resistance, discussed options of therapy and we will check labs and start medication if insulin is high. She has been walking but states she lose a little weight and gains in back.    Patient Active Problem List   Diagnosis Date Noted  . Vitamin D deficiency 02/07/2017  . Acanthosis nigricans 02/22/2016  . 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 Obesity (BMI 30.0-34.9) 01/12/2016  . MS (multiple sclerosis) ([place]) 01/12/2016  . B12 deficiency 12/23/2015  . IUD contraception 04/02/2015    No past surgical history on file.  Family History  Problem Relation Age of Onset  . Alcohol abuse Mother   . Alcohol abuse Father   . Drug abuse Father     Social History   Social History  . Marital status: Married    Spouse name: N/A  . Number of children: N/A  . Years of education: N/A   Occupational History  . teacher [employer]   Social History Main Topics  . Smoking status: Never Smoker  . Smokeless tobacco: Never Used  . Alcohol use No  . Drug use: No  . Sexual activity: Yes   Other Topics Concern   . Not on file   Social History Narrative  . No narrative on file     Current Outpatient Prescriptions:  .  Azelastine-Fluticasone (DYMISTA) 137-50 MCG/ACT SUSP, Place 1 spray into the nose 2 (two) times daily., Disp: 23 g, Rfl: 2 .  Cyanocobalamin 2500 MCG SUBL, once daily., Disp: , Rfl:  .  Dimethyl Fumarate (TECFIDERA) 240 MG CPDR, Take by mouth., Disp: , Rfl:  .  levocetirizine (XYZAL) 5 MG tablet, Take 1 tablet (5 mg total) by mouth every evening., Disp: 30 tablet, Rfl: 2 .  levonorgestrel (MIRENA, 52 MG,) 20 MCG/24HR IUD, 1 each by Intrauterine route., Disp: , Rfl:  .  montelukast (SINGULAIR) 10 MG tablet, Take 1 tablet (10 mg total) by mouth at bedtime., Disp: 30 tablet, Rfl: 3 .  Vitamin D, Ergocalciferol, (DRISDOL) 50000 units CAPS capsule, Take by mouth., Disp: , Rfl:   Current Facility-Administered Medications:  .  cyanocobalamin ((VITAMIN B-12)) injection 1,000 mcg, 1,000 mcg, Intramuscular, Once, Steele Sizer, MD  No Known Allergies   ROS  Constitutional: Negative for fever or weight change.  Respiratory: Positive  for cough and shortness of breath.   Cardiovascular: Negative for chest pain or palpitations.  Gastrointestinal: Negative for abdominal pain, no bowel changes.  Musculoskeletal: Negative for gait problem or joint swelling.  Skin: Negative for rash.  Neurological: Negative for dizziness or headache.  No other specific complaints in a complete review of systems (except as listed in HPI above).  Objective  Vitals:   02/07/17 1059  BP: 114/76  Pulse: 96  Resp: 16  Temp: 98.6 F (37 C)  TempSrc: Oral  SpO2: 97%  Weight: 213 lb 12.8 oz (97 kg)  Height: 5\' 5"  (1.651 m)    Body mass index is 35.58 kg/m.  Physical Exam  Constitutional: Patient appears well-developed and well-nourished. Obese No distress.  HEENT: head atraumatic, normocephalic, pupils equal and reactive to light,  neck supple, throat within normal limits Cardiovascular: Normal  rate, regular rhythm and normal heart sounds.  No murmur heard. No BLE edema. Pulmonary/Chest: Effort normal and breath sounds normal. No respiratory distress. Abdominal: Soft.  There is no tenderness. Psychiatric: Patient has a normal mood and affect. behavior is normal. Judgment and thought content normal. Neurologist: she still has intermittent paresthesia on her neck , normal grip. No focal findinds  PHQ2/9: Depression screen [facility] 2/9 02/07/2017 02/22/2016 12/23/2015  Decreased Interest 0 0 0  Down, Depressed, Hopeless 0 0 0  PHQ - 2 Score 0 0 0     Fall Risk: Fall Risk  02/07/2017 02/22/2016 12/23/2015  Falls in the past year? No No No     Functional Status Survey: Is the patient deaf or have difficulty hearing?: No Does the patient have difficulty seeing, even when wearing glasses/contacts?: No Does the patient have difficulty concentrating, remembering, or making decisions?: No Does the patient have difficulty walking or climbing stairs?: No Does the patient have difficulty dressing or bathing?: No Does the patient have difficulty doing errands alone such as visiting a doctor's office or shopping?: No   Assessment & Plan  1. Vitamin D deficiency  Continue supplementation   2. Obesity (BMI 30.0-34.9)  Discussed all optiosns for weight loss medications including Belviq, Qsymia, Saxenda and Contrave. Discussed risk and benefits of each of them. We will check labs and consider starting her on medication. We will try Saxenda or Ozempic first   3. MS (multiple sclerosis) ([place])  Continue follow up with Dr. Manuella Ghazi   4. B12 deficiency  - cyanocobalamin ((VITAMIN B-12)) injection 1,000 mcg; Inject 1 mL (1,000 mcg total) into the muscle once.  5. Acanthosis nigricans  -insulin  -hgbA1C  6. Other seasonal allergic rhinitis  - levocetirizine (XYZAL) 5 MG tablet; Take 1 tablet (5 mg total) by mouth every evening.  Dispense: 30 tablet; Refill: 2 - montelukast (SINGULAIR) 10 MG  tablet; Take 1 tablet (10 mg total) by mouth at bedtime.  Dispense: 30 tablet; Refill: 3 - Azelastine-Fluticasone (DYMISTA) 137-50 MCG/ACT SUSP; Place 1 spray into the nose 2 (two) times daily.  Dispense: 23 g; Refill: 2  7. Dry cough  - montelukast (SINGULAIR) 10 MG tablet; Take 1 tablet (10 mg total) by mouth at bedtime.  Dispense: 30 tablet; Refill: 3 - Spirometry: Pre & Post Eval  8. SOB (shortness of breath) on exertion  - Spirometry: Pre & Post Eval

## 2017-02-08 LAB — CBC
HEMATOCRIT: 40.9 % (ref 35.0–45.0)
HEMOGLOBIN: 13.8 g/dL (ref 11.7–15.5)
MCH: 30.1 pg (ref 27.0–33.0)
MCHC: 33.7 g/dL (ref 32.0–36.0)
MCV: 89.1 fL (ref 80.0–100.0)
MPV: 9.8 fL (ref 7.5–12.5)
Platelets: 236 10*3/uL (ref 140–400)
RBC: 4.59 MIL/uL (ref 3.80–5.10)
RDW: 13.1 % (ref 11.0–15.0)
WBC: 8.3 10*3/uL (ref 3.8–10.8)

## 2017-02-09 ENCOUNTER — Other Ambulatory Visit: Payer: Self-pay | Admitting: Family Medicine

## 2017-02-09 LAB — INSULIN, FASTING: INSULIN FASTING, SERUM: 188.9 u[IU]/mL — AB (ref 2.0–19.6)

## 2017-02-09 MED ORDER — SEMAGLUTIDE(0.25 OR 0.5MG/DOS) 2 MG/1.5ML ~~LOC~~ SOPN: 0.2500 mg | PEN_INJECTOR | SUBCUTANEOUS | 2 refills | Status: DC

## 2017-02-15 ENCOUNTER — Telehealth: Payer: Self-pay | Admitting: Family Medicine

## 2017-02-15 MED ORDER — ONETOUCH ULTRASOFT LANCETS MISC: 12 refills | Status: DC

## 2017-02-15 MED ORDER — GLUCOSE BLOOD VI STRP: ORAL_STRIP | 12 refills | Status: DC

## 2017-02-15 NOTE — Telephone Encounter (Signed)
Pt has questions about her One Touch Ultra.

## 2017-05-08 ENCOUNTER — Encounter: Payer: Self-pay | Admitting: Family Medicine

## 2017-05-08 ENCOUNTER — Ambulatory Visit (INDEPENDENT_AMBULATORY_CARE_PROVIDER_SITE_OTHER): Payer: BLUE CROSS/BLUE SHIELD | Admitting: Family Medicine

## 2017-05-08 VITALS — BP 110/68 | HR 73 | Temp 97.6°F | Resp 16 | Ht 65.0 in | Wt 193.2 lb

## 2017-05-08 DIAGNOSIS — E785 Hyperlipidemia, unspecified: Secondary | ICD-10-CM

## 2017-05-08 DIAGNOSIS — J302 Other seasonal allergic rhinitis: Secondary | ICD-10-CM | POA: Insufficient documentation

## 2017-05-08 DIAGNOSIS — Z79899 Other long term (current) drug therapy: Secondary | ICD-10-CM

## 2017-05-08 DIAGNOSIS — J301 Allergic rhinitis due to pollen: Secondary | ICD-10-CM

## 2017-05-08 DIAGNOSIS — E8881 Metabolic syndrome: Secondary | ICD-10-CM

## 2017-05-08 DIAGNOSIS — E669 Obesity, unspecified: Secondary | ICD-10-CM

## 2017-05-08 DIAGNOSIS — M5432 Sciatica, left side: Secondary | ICD-10-CM

## 2017-05-08 DIAGNOSIS — E559 Vitamin D deficiency, unspecified: Secondary | ICD-10-CM

## 2017-05-08 DIAGNOSIS — E538 Deficiency of other specified B group vitamins: Secondary | ICD-10-CM

## 2017-05-08 LAB — LIPID PANEL
CHOL/HDL RATIO: 5 ratio — AB (ref <5.0)
CHOLESTEROL: 181 mg/dL (ref <200)
HDL: 36 mg/dL — AB (ref >50)
LDL Cholesterol: 126 mg/dL — ABNORMAL HIGH (ref <100)
TRIGLYCERIDES: 97 mg/dL (ref <150)
VLDL: 19 mg/dL (ref <30)

## 2017-05-08 LAB — COMPLETE METABOLIC PANEL WITH GFR
ALBUMIN: 4.2 g/dL (ref 3.6–5.1)
ALK PHOS: 90 U/L (ref 33–115)
ALT: 12 U/L (ref 6–29)
AST: 14 U/L (ref 10–30)
BUN: 11 mg/dL (ref 7–25)
CO2: 27 mmol/L (ref 20–31)
Calcium: 9 mg/dL (ref 8.6–10.2)
Chloride: 108 mmol/L (ref 98–110)
Creat: 0.91 mg/dL (ref 0.50–1.10)
GFR, Est African American: 89 mL/min (ref >=60)
GFR, Est Non African American: 78 mL/min (ref >=60)
GLUCOSE: 83 mg/dL (ref 65–99)
Potassium: 4.4 mmol/L (ref 3.5–5.3)
SODIUM: 141 mmol/L (ref 135–146)
TOTAL PROTEIN: 7 g/dL (ref 6.1–8.1)
Total Bilirubin: 0.6 mg/dL (ref 0.2–1.2)

## 2017-05-08 MED ORDER — SEMAGLUTIDE(0.25 OR 0.5MG/DOS) 2 MG/1.5ML ~~LOC~~ SOPN: 0.2500 mg | PEN_INJECTOR | SUBCUTANEOUS | 2 refills | Status: DC

## 2017-05-08 MED ORDER — CYANOCOBALAMIN 1000 MCG/ML IJ SOLN
1000.0000 ug | Freq: Once | INTRAMUSCULAR | Status: AC
  Administered 2017-05-08: 1000 ug via INTRAMUSCULAR

## 2017-05-08 MED ORDER — NAPROXEN 500 MG PO TABS: 500.0000 mg | ORAL_TABLET | Freq: Two times a day (BID) | ORAL | 0 refills | Status: DC

## 2017-05-08 NOTE — Progress Notes (Signed)
Name: Hannah Terry   MRN: [id number]    DOB: [birth year]   Date:05/08/2017       Progress Note  Subjective  Chief Complaint  Chief Complaint  Patient presents with  . Obesity    pt stated that medication is working for her, pt stated that she has had some spotting for 2 months since starting medciation                                                 . Pain    left hip started Friday shoots from hip down into her foot worse at night    HPI  AR: she has not been taking  medication, she has nasal congestion and sneezing but she also has a dry cough but still has intermittent wheezing, spirometry was normal Feb 2018. No orthopnea or lower extremity edema. She states she has been using son's inhaler. She states she will start taking Singulair daily and if no improvement she will call back for inhaler  Vitamin D deficiency and B12: labs done in 12/2016 by Dr. Manuella Ghazi, she is taking oral supplementation but willing to get B12 injections  Obesity//pre-diabetes:her insulin fasting was very high, has acanthosis nigricans, she was started on Ozempic 01/2017 and is doing great. She has lost 19 lbs ad is doing well. She still eats sweets, but down to twice daily ( usually honey bun and sugar in her coffee ), she still has sweets in the pm. She will try to change her diet.   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 She is states Friday after work she noticed pain on left buttocks radiating to left lateral thigh, getting progressively worse on her leg - burning and tingling going down to lateral aspect of left foot, no weakness, no bowel or bladder incontinence. She took ibuprofen and heating pad last night and is feeling a little better today.     Patient Active Problem List   Diagnosis Date Noted  . Vitamin D deficiency 02/07/2017  . Acanthosis nigricans 02/22/2016  . 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 Obesity (BMI 30.0-34.9) 01/12/2016  . MS (multiple sclerosis) ([place])  01/12/2016  . B12 deficiency 12/23/2015  . IUD contraception 04/02/2015    History reviewed. No pertinent surgical history.  Family History  Problem Relation Age of Onset  . Alcohol abuse Mother   . Alcohol abuse Father   . Drug abuse Father     Social History   Social History  . Marital status: Married    Spouse name: N/A  . Number of children: N/A  . Years of education: N/A   Occupational History  . teacher [employer]   Social History Main Topics  . Smoking status: Never Smoker  . Smokeless tobacco: Never Used  . Alcohol use No  . Drug use: No  . Sexual activity: Yes   Other Topics Concern  . Not on file   Social History Narrative  . No narrative on file     Current Outpatient Prescriptions:  .  Azelastine-Fluticasone (DYMISTA) 137-50 MCG/ACT SUSP, Place 1 spray into the nose 2 (two) times daily., Disp: 23 g, Rfl: 2 .  Cyanocobalamin 2500 MCG SUBL, once daily., Disp: , Rfl:  .  Dimethyl Fumarate (TECFIDERA) 240 MG CPDR, Take by mouth., Disp: , Rfl:  .  glucose blood test strip, Use as instructed, Disp: 100 each, Rfl: 12 .  Lancets (ONETOUCH ULTRASOFT) lancets, Use as instructed, Disp: 100 each, Rfl: 12 .  levocetirizine (XYZAL) 5 MG tablet, Take 1 tablet (5 mg total) by mouth every evening., Disp: 30 tablet, Rfl: 2 .  levonorgestrel (MIRENA, 52 MG,) 20 MCG/24HR IUD, 1 each by Intrauterine route., Disp: , Rfl:  .  montelukast (SINGULAIR) 10 MG tablet, Take 1 tablet (10 mg total) by mouth at bedtime., Disp: 30 tablet, Rfl: 3 .  naproxen (NAPROSYN) 500 MG tablet, Take 1 tablet (500 mg total) by mouth 2 (two) times daily with a meal., Disp: 30 tablet, Rfl: 0 .  Semaglutide (OZEMPIC) 0.25 or 0.5 MG/DOSE SOPN, Inject 0.25 mg into the skin once a week. For the first two weeks after that 0.5 mg weekly, Disp: 2 pen, Rfl: 2  No Known Allergies   ROS  Constitutional: Negative for fever, positive for  weight change.  Respiratory: Negative for  cough and shortness of breath.   Cardiovascular: Negative for chest pain or palpitations.  Gastrointestinal: Negative for abdominal pain, no bowel changes.  Musculoskeletal: Negative for gait problem or joint swelling.  Skin: Negative for rash.  Neurological: Negative for dizziness or headache.  No other specific complaints in a complete review of systems (except as listed in HPI above).  Objective  Vitals:   05/08/17 0848  BP: 110/68  Pulse: 73  Resp: 16  Temp: 97.6 F (36.4 C)  SpO2: 97%  Weight: 193 lb 4 oz (87.7 kg)  Height: 5\' 5"  (1.651 m)    Body mass index is 32.16 kg/m.  Physical Exam  Constitutional: Patient appears well-developed and well-nourished. Obese No distress.  HEENT: head atraumatic, normocephalic, pupils equal and reactive to light,neck supple, throat within normal limits Cardiovascular: Normal rate, regular rhythm and normal heart sounds.  No murmur heard. No BLE edema. Pulmonary/Chest: Effort normal and breath sounds normal. No respiratory distress. Abdominal: Soft.  There is no tenderness. Muscular Skeletal: pain during palpitation of sciatic notch, negative straight leg raise.  Psychiatric: Patient has a normal mood and affect. behavior is normal. Judgment and thought content normal.  Recent Results (from the past 2160 hour(s))  CBC     Status: None   Collection Time: 02/07/17 11:37 AM  Result Value Ref Range   WBC 8.3 3.8 - 10.8 K/uL   RBC 4.59 3.80 - 5.10 MIL/uL   Hemoglobin 13.8 11.7 - 15.5 g/dL   HCT 40.9 35.0 - 45.0 %   MCV 89.1 80.0 - 100.0 fL   MCH 30.1 27.0 - 33.0 pg   MCHC 33.7 32.0 - 36.0 g/dL   RDW 13.1 11.0 - 15.0 %   Platelets 236 140 - 400 K/uL   MPV 9.8 7.5 - 12.5 fL    PHQ2/9: Depression screen [facility] 2/9 02/07/2017 02/22/2016 12/23/2015  Decreased Interest 0 0 0  Down, Depressed, Hopeless 0 0 0  PHQ - 2 Score 0 0 0     Fall Risk: Fall Risk  02/07/2017 02/22/2016 12/23/2015  Falls in the past year? No No No     Assessment &  Plan  1. Obesity (BMI 30.0-34.9)  Doing well, lost 19 lbs on Ozempic - Semaglutide (OZEMPIC) 0.25 or 0.5 MG/DOSE SOPN; Inject 0.25 mg into the skin once a week. For the first two weeks after that 0.5 mg weekly  Dispense: 2 pen; Refill: 2  2. Insulin resistance  - Hemoglobin A1c - Insulin, fasting - Semaglutide (OZEMPIC)  0.25 or 0.5 MG/DOSE SOPN; Inject 0.25 mg into the skin once a week. For the first two weeks after that 0.5 mg weekly  Dispense: 2 pen; Refill: 2  She is doing well, taking medication, going to the gym, fatigue improved   3. Vitamin D deficiency  - VITAMIN D 25 Hydroxy (Vit-D Deficiency, Fractures)  4. B12 deficiency  - Vitamin B12  5. Long-term use of high-risk medication  - COMPLETE METABOLIC PANEL WITH GFR  6. Dyslipidemia  - Lipid panel  7. Sciatica of left side  - naproxen (NAPROSYN) 500 MG tablet; Take 1 tablet (500 mg total) by mouth 2 (two) times daily with a meal.  Dispense: 30 tablet; Refill: 0  Discussed prednisone taper, however she wants to try NSAID's for now, and will call back in 2 days if not better for refill of prednisone  8. Seasonal allergic rhinitis due to pollen  She has not been taking her medications daily and has noticed some chest congestion, advised to resume medication

## 2017-05-09 LAB — VITAMIN D 25 HYDROXY (VIT D DEFICIENCY, FRACTURES): Vit D, 25-Hydroxy: 20 ng/mL — ABNORMAL LOW (ref 30–100)

## 2017-05-09 LAB — HEMOGLOBIN A1C
HEMOGLOBIN A1C: 4.9 % (ref <5.7)
MEAN PLASMA GLUCOSE: 94 mg/dL

## 2017-05-09 LAB — VITAMIN B12: Vitamin B-12: 351 pg/mL (ref 200–1100)

## 2017-05-09 LAB — INSULIN, FASTING: Insulin fasting, serum: 24.3 u[IU]/mL — ABNORMAL HIGH (ref 2.0–19.6)

## 2017-05-11 ENCOUNTER — Telehealth: Payer: Self-pay | Admitting: Family Medicine

## 2017-05-11 NOTE — Progress Notes (Signed)
Patient called in requesting to review lab work. Please see telephone call documentation for details.

## 2017-05-11 NOTE — Telephone Encounter (Signed)
Requesting return call she is wanting her lab results [phone]

## 2017-05-11 NOTE — Telephone Encounter (Addendum)
Left message on machine for patient to call back to review lab results.   -Will start Atorvastatin 20mg  due to LDL of 126 and patient's history of Insulin resistance after speaking with patient to discuss the medication and potential side effects.  If patient calls back and I am unavailable: -Please advise that her Vitamin D level was low and ask what dosage of OTC vitamin D supplementation she takes so that we may advise accordingly. -Vitamin B was normal, CMP normal, A1C looks great at 4.9,  -Fasting insulin is high at 24.3, and she should work on healthy lifestyle modifications and continue Ozempic as already prescribed. -Bad cholesterol (LDL)  is above the goal for a diabetic (want it to be <100) -Good cholesterol (HDL) is low, and she needs to exercise, avoid fried food and sweets, eat two servings of fish weekly, and eat one serving of tree nuts ( cashews, pistachios, pecans, almonds.Marland Kitchen) every other day  Thank you!

## 2017-05-16 ENCOUNTER — Telehealth: Payer: Self-pay

## 2017-05-16 NOTE — Telephone Encounter (Signed)
Patient notified of lab results by phone and would like to work on diet and exercise first before starting cholesterol medication. Has been taking Ozempic for insulin resistance and states she is doing well with it.

## 2017-05-17 NOTE — Telephone Encounter (Signed)
See telephone encounter documentation from 05/16/2017.

## 2017-06-12 ENCOUNTER — Telehealth: Payer: Self-pay | Admitting: Family Medicine

## 2017-06-12 NOTE — Telephone Encounter (Signed)
Would like to know which medication was prescribed to her for sinus drainage. Please return call [phone]

## 2017-06-12 NOTE — Telephone Encounter (Signed)
Called and informed patient she is on Dymista, Singular and Xyzal.

## 2017-06-29 ENCOUNTER — Telehealth: Payer: Self-pay

## 2017-06-29 NOTE — Telephone Encounter (Signed)
Patient needs a TB quantiferon test done for her new job. Could you please order so patient could have it done soon as possible. Thanks.

## 2017-06-30 ENCOUNTER — Other Ambulatory Visit: Payer: Self-pay | Admitting: Family Medicine

## 2017-06-30 DIAGNOSIS — Z111 Encounter for screening for respiratory tuberculosis: Secondary | ICD-10-CM

## 2017-06-30 NOTE — Telephone Encounter (Signed)
Sent to you instead

## 2017-07-03 ENCOUNTER — Other Ambulatory Visit: Payer: Self-pay

## 2017-07-03 DIAGNOSIS — Z111 Encounter for screening for respiratory tuberculosis: Secondary | ICD-10-CM

## 2017-07-03 NOTE — Telephone Encounter (Signed)
Lab is upfront and ready for patient to have done. Called and left this on patient's voicemail.

## 2017-07-13 ENCOUNTER — Other Ambulatory Visit: Payer: Self-pay | Admitting: Family Medicine

## 2017-07-13 ENCOUNTER — Telehealth: Payer: Self-pay

## 2017-07-13 DIAGNOSIS — M5432 Sciatica, left side: Secondary | ICD-10-CM

## 2017-07-13 DIAGNOSIS — Z0289 Encounter for other administrative examinations: Secondary | ICD-10-CM

## 2017-07-13 NOTE — Telephone Encounter (Signed)
done

## 2017-07-13 NOTE — Telephone Encounter (Signed)
Pt came into office to have Quantiferon gold drawn but she also needed MMR and Varicella immunology drawn for work, can you place an order for those

## 2017-07-14 LAB — MEASLES/MUMPS/RUBELLA IMMUNITY
Mumps IgG: 29.5 AU/mL — ABNORMAL HIGH (ref <9.00)
RUBEOLA IGG: 210 [AU]/ml — AB (ref <25.00)
Rubella: 5.08 Index — ABNORMAL HIGH (ref <0.90)

## 2017-07-14 LAB — VARICELLA ZOSTER ANTIBODY, IGG: VARICELLA IGG: 1557 {index} — AB (ref <135.00)

## 2017-07-15 LAB — QUANTIFERON TB GOLD ASSAY (BLOOD)
Interferon Gamma Release Assay: POSITIVE — AB
Mitogen-Nil: >10 IU/mL
Quantiferon Nil Value: 0.05 IU/mL
Quantiferon Tb Ag Minus Nil Value: 8.27 IU/mL

## 2017-07-18 ENCOUNTER — Telehealth: Payer: Self-pay | Admitting: Family Medicine

## 2017-07-18 NOTE — Telephone Encounter (Signed)
Pt stated that she had tested positive a long time ago and that she went to the health dept. and was treated. She stated that she has spoken to someone from the health dept and that they are going to try to find a record of that

## 2017-07-18 NOTE — Telephone Encounter (Signed)
Please return pt call. Would like a hard copy of test results and have a few questions pertaining to the results.

## 2017-08-08 ENCOUNTER — Ambulatory Visit: Payer: BLUE CROSS/BLUE SHIELD | Admitting: Family Medicine

## 2017-08-14 ENCOUNTER — Ambulatory Visit: Payer: BLUE CROSS/BLUE SHIELD | Admitting: Family Medicine

## 2017-08-22 ENCOUNTER — Encounter: Payer: Self-pay | Admitting: Family Medicine

## 2017-08-22 ENCOUNTER — Ambulatory Visit (INDEPENDENT_AMBULATORY_CARE_PROVIDER_SITE_OTHER): Payer: BLUE CROSS/BLUE SHIELD | Admitting: Family Medicine

## 2017-08-22 VITALS — BP 110/68 | HR 83 | Temp 98.4°F | Resp 16 | Ht 65.0 in | Wt 183.1 lb

## 2017-08-22 DIAGNOSIS — E538 Deficiency of other specified B group vitamins: Secondary | ICD-10-CM

## 2017-08-22 DIAGNOSIS — Z113 Encounter for screening for infections with a predominantly sexual mode of transmission: Secondary | ICD-10-CM

## 2017-08-22 DIAGNOSIS — E8881 Metabolic syndrome: Secondary | ICD-10-CM | POA: Diagnosis not present

## 2017-08-22 DIAGNOSIS — F419 Anxiety disorder, unspecified: Secondary | ICD-10-CM | POA: Diagnosis not present

## 2017-08-22 DIAGNOSIS — Z630 Problems in relationship with spouse or partner: Secondary | ICD-10-CM

## 2017-08-22 DIAGNOSIS — N930 Postcoital and contact bleeding: Secondary | ICD-10-CM | POA: Diagnosis not present

## 2017-08-22 DIAGNOSIS — E669 Obesity, unspecified: Secondary | ICD-10-CM | POA: Diagnosis not present

## 2017-08-22 DIAGNOSIS — E559 Vitamin D deficiency, unspecified: Secondary | ICD-10-CM

## 2017-08-22 DIAGNOSIS — J301 Allergic rhinitis due to pollen: Secondary | ICD-10-CM

## 2017-08-22 MED ORDER — VITAMIN D 50 MCG (2000 UT) PO CAPS: 1.0000 | ORAL_CAPSULE | Freq: Every day | ORAL | 0 refills | Status: AC

## 2017-08-22 MED ORDER — SEMAGLUTIDE(0.25 OR 0.5MG/DOS) 2 MG/1.5ML ~~LOC~~ SOPN: 0.5000 mg | PEN_INJECTOR | SUBCUTANEOUS | 2 refills | Status: DC

## 2017-08-22 MED ORDER — CYANOCOBALAMIN 1000 MCG/ML IJ SOLN
1000.0000 ug | Freq: Once | INTRAMUSCULAR | Status: AC
  Administered 2017-08-22: 1000 ug via INTRAMUSCULAR

## 2017-08-22 MED ORDER — CYANOCOBALAMIN 1000 MCG/ML IJ SOLN: 1000.0000 ug | Freq: Once | INTRAMUSCULAR | 0 refills | Status: DC

## 2017-08-22 NOTE — Patient Instructions (Signed)
You can try 0.25 every 4 days to see if nausea improves

## 2017-08-22 NOTE — Progress Notes (Signed)
Name: Hannah Terry   MRN: [id number]    DOB: 1971-10-02   Date:08/22/2017       Progress Note  Subjective  Chief Complaint  Chief Complaint  Patient presents with  . Obesity    3 month follow up wants to go down to lower dosage stated that she does not feel well sometimes  . Vaginal Bleeding    spotting    HPI  Post-coital bleeding: her husband cheated on her in 2011 and she is not sure if it is happening again, he is currently not working ( injury back in 12/2016) , thinking about separation, since weight loss she started to spot again and over the past three weeks she has noticed bleeding during and after intercourse also has some pain during sex.   Vitamin D deficiency and B12: labs done in 12/2016 by Dr. Manuella Ghazi, she is taking oral supplementation but willing to get B12 injections, she skipped past two months but will have it again today   Obesity: she has insulin resistance, changed diet and was started on Ozempic 01/2017 , initial weight was 213 lbs, and she is down 30 lbs today. She is feeling well, however noticed that higher dose of Ozempic makes has feel dizzy and nauseated for the first day, discussed going down and consider giving herself a 0.25 shot every 4 days and monitor or continue weekly but 0.25.   SOB: she has anxiety, GAD was high and last visit had a normal spirometry, however had to move her sister to [place] and after that move to her sister's house here in [place], she has noticed increase in anxiety and is associated with chest heaviness and SOB, , no calf pain. She thinks it is worse when anxious, symptoms are intermittent, no cough, or fever. She wants to hold off on starting on anxiety medication   MS: she is still seeing neurologist and compliant with medication, no recent flares.    Patient Active Problem List   Diagnosis Date Noted  . Allergic rhinitis, seasonal 05/08/2017  . Vitamin D deficiency 02/07/2017  . Acanthosis nigricans 02/22/2016  .  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 Obesity (BMI 30.0-34.9) 01/12/2016  . MS (multiple sclerosis) ([place]) 01/12/2016  . B12 deficiency 12/23/2015  . IUD contraception 04/02/2015    History reviewed. No pertinent surgical history.  Family History  Problem Relation Age of Onset  . Alcohol abuse Mother   . Alcohol abuse Father   . Drug abuse Father     Social History   Social History  . Marital status: Married    Spouse name: N/A  . Number of children: N/A  . Years of education: N/A   Occupational History  . teacher [employer]   Social History Main Topics  . Smoking status: Never Smoker  . Smokeless tobacco: Never Used  . Alcohol use No  . Drug use: No  . Sexual activity: Yes   Other Topics Concern  . Not on file   Social History Narrative  . No narrative on file     Current Outpatient Prescriptions:  .  Azelastine-Fluticasone (DYMISTA) 137-50 MCG/ACT SUSP, Place 1 spray into the nose 2 (two) times daily., Disp: 23 g, Rfl: 2 .  Dimethyl Fumarate (TECFIDERA) 240 MG CPDR, Take by mouth., Disp: , Rfl:  .  glucose blood test strip, Use as instructed, Disp: 100 each, Rfl: 12 .  Lancets (ONETOUCH ULTRASOFT) lancets, Use as instructed, Disp: 100 each, Rfl: 12 .  levocetirizine (XYZAL)  5 MG tablet, Take 1 tablet (5 mg total) by mouth every evening., Disp: 30 tablet, Rfl: 2 .  levonorgestrel (MIRENA, 52 MG,) 20 MCG/24HR IUD, 1 each by Intrauterine route., Disp: , Rfl:  .  montelukast (SINGULAIR) 10 MG tablet, Take 1 tablet (10 mg total) by mouth at bedtime., Disp: 30 tablet, Rfl: 3 .  naproxen (NAPROSYN) 500 MG tablet, take 1 tablet by mouth twice a day with A MEAL, Disp: 30 tablet, Rfl: 0 .  Semaglutide (OZEMPIC) 0.25 or 0.5 MG/DOSE SOPN, Inject 0.5 mg into the skin once a week. For the first two weeks after that 0.5 mg weekly, Disp: 2 pen, Rfl: 2 .  Cholecalciferol (VITAMIN D) 2000 units CAPS, Take 1 capsule (2,000 Units total) by mouth daily., Disp: 30  capsule, Rfl: 0 .  cyanocobalamin (,VITAMIN B-12,) 1000 MCG/ML injection, Inject 1 mL (1,000 mcg total) into the muscle once., Disp: 1 mL, Rfl: 0  No Known Allergies   ROS  Constitutional: Negative for fever, positive for  weight change.  Respiratory: Negative for cough, positive for  shortness of breath - intermittently seems to be related to anxiety    Cardiovascular: Negative for chest pain or palpitations.  Gastrointestinal: Negative for abdominal pain, no bowel changes.  Musculoskeletal: Negative for gait problem or joint swelling.  Skin: Negative for rash.  Neurological: Negative for dizziness or headache.  No other specific complaints in a complete review of systems (except as listed in HPI above).  Objective  Vitals:   08/22/17 1503  BP: 110/68  Pulse: 83  Resp: 16  Temp: 98.4 F (36.9 C)  SpO2: 98%  Weight: 183 lb 1 oz (83 kg)  Height: 5\' 5"  (1.651 m)    Body mass index is 30.46 kg/m.  Physical Exam  Constitutional: Patient appears well-developed and well-nourished. Obese No distress.  HEENT: head atraumatic, normocephalic, pupils equal and reactive to light,  neck supple, throat within normal limits Cardiovascular: Normal rate, regular rhythm and normal heart sounds.  No murmur heard. No BLE edema. Pulmonary/Chest: Effort normal and breath sounds normal. No respiratory distress. Abdominal: Soft.  There is no tenderness. Psychiatric: Patient has a normal mood and affect. behavior is normal. Judgment and thought content normal.  Recent Results (from the past 2160 hour(s))  Quantiferon tb gold assay (blood)     Status: Abnormal   Collection Time: 07/13/17  9:58 AM  Result Value Ref Range   Interferon Gamma Release Assay POSITIVE (A) NEGATIVE    Comment: In healthy persons who have a low likelihood both of M. tuberculosis infection and of progression to active tuberculosis if infected, a single positive QFT result should not be taken as reliable evidence of M.  tuberculosis infection. Repeat testing, with either the initial test or a different test, may be considered on a case-by-case basis.    Quantiferon Nil Value 0.05 IU/mL   Mitogen-Nil >10.00 IU/mL   Quantiferon Tb Ag Minus Nil Value 8.27 IU/mL    Comment:   The Nil tube value is used to determine if the patient has a preexisting immune response which could cause a false-positive reading on the test. In order for a test to be valid, the Nil tube must have a value of less than or equal to 8.0 IU/mL.   The mitogen control tube is used to assure the patient has a healthy immune status and also serves as a control for correct blood handling and incubation. It is used to detect false-negative readings. The mitogen tube  must have a gamma interferon value of greater than or equal to 0.5 IU/mL higher than the value of the Nil tube.   The TB antigen tube is coated with the M. tuberculosis specific antigens. For a test to be considered positive, the TB antigen tube value minus the Nil tube value must be greater than or equal to 0.35 IU/mL.   For additional information, please refer to http://education.[facility].com/faq/QFT (This link is being provided for informational/educational purposes only.)   Varicella zoster antibody, IgG     Status: Abnormal   Collection Time: 07/13/17 11:06 AM  Result Value Ref Range   Varicella IgG 1,557.00 (H) <135.00 Index    Comment:        Index           Interpretation      =====           ==============      < 135.00          Negative      135.00-164.99     Equivocal      >= 165.00         Positive   A positive result indicates that the patient has antibody to VZV but does not differentiate between an active or past infection. The clinical diagnosis must be interpreted in conjunction with the clinical signs and symptoms of the patient. This assay reliably measures immunity due to previous infection but may not be sensitive enough to detect  antibodies induced by vaccination. Thus, a negative result in a vaccinated individual does not necessarily indicate susceptibility to VZV infection.     Measles/Mumps/Rubella Immunity     Status: Abnormal   Collection Time: 07/13/17 11:06 AM  Result Value Ref Range   Rubella 5.08 (H) <0.90 Index    Comment:                  Index          Interpretation                =====          ==============                <0.90          Not consistent with immunity                0.90-0.99      Equivocal                >=1.00         Consistent with immunity   The presence of Rubella IgG antibody suggests immunization or past or current infection with Rubella virus.    Mumps IgG 29.50 (H) <9.00 AU/mL    Comment:        AU/mL                Interpretation      =====                ==============      < 9.00               Negative      9.00 - 10.99         Equivocal      > 10.99              Positive   A positive result indicates that the patient has antibody to mumps virus. It does not differentiate between an active or  past infection. The clinical diagnosis must be interpreted in conjunction with clinical signs and symptoms of the patient.    Rubeola IgG 210.00 (H) <25.00 AU/mL    Comment:        AU/mL                Interpretation      =====                ==============      < 25.00              Negative      25.00 - 29.99        Equivocal      > 29.99              Positive   A positive result indicates that the patient has antibody to measles virus. It does not differentiate between an active or past infection. The clinical diagnosis must be interpreted in conjunction with clinical signs and symptoms of the patient.      PHQ2/9: Depression screen [facility] 2/9 02/07/2017 02/22/2016 12/23/2015  Decreased Interest 0 0 0  Down, Depressed, Hopeless 0 0 0  PHQ - 2 Score 0 0 0     Fall Risk: Fall Risk  02/07/2017 02/22/2016 12/23/2015  Falls in the past year? No No No    Assessment &  Plan  1. Insulin resistance  - Semaglutide (OZEMPIC) 0.25 or 0.5 MG/DOSE SOPN; Inject 0.5 mg into the skin once a week. For the first two weeks after that 0.5 mg weekly  Dispense: 2 pen; Refill: 2  2. Obesity (BMI 30.0-34.9)  Doing well, lost 30 lbs since last visit  - Semaglutide (OZEMPIC) 0.25 or 0.5 MG/DOSE SOPN; Inject 0.5 mg into the skin once a week. For the first two weeks after that 0.5 mg weekly  Dispense: 2 pen; Refill: 2  3. Vitamin D deficiency  - Cholecalciferol (VITAMIN D) 2000 units CAPS; Take 1 capsule (2,000 Units total) by mouth daily.  Dispense: 30 capsule; Refill: 0  4. B12 deficiency  -B12 injection   5. Seasonal allergic rhinitis due to pollen   6. PCB (post coital bleeding)  - Ambulatory referral to Obstetrics / Gynecology  7. Anxiety  She wants to hold off on medication for now, she states she was off Ozempic and thinks related to that, last visit we checked spirometry and was normal, she wants to hold off on further testing

## 2017-08-24 ENCOUNTER — Other Ambulatory Visit: Payer: Self-pay | Admitting: Family Medicine

## 2017-08-24 LAB — GC/CHLAMYDIA PROBE AMP
CT PROBE, AMP APTIMA: NOT DETECTED
GC PROBE AMP APTIMA: NOT DETECTED

## 2017-08-24 NOTE — Telephone Encounter (Signed)
Pt calling for test results and also needing refil on one touch ultra testing strips and meter. Please send to [facility]

## 2017-08-28 MED ORDER — ONETOUCH ULTRASOFT LANCETS MISC: 12 refills | Status: DC

## 2017-08-28 MED ORDER — GLUCOSE BLOOD VI STRP: ORAL_STRIP | 12 refills | Status: DC

## 2017-09-19 ENCOUNTER — Telehealth: Payer: Self-pay | Admitting: Family Medicine

## 2017-09-19 NOTE — Telephone Encounter (Signed)
Patient would have contact [facility] billing if there is no additional diagnosis code

## 2017-09-19 NOTE — Telephone Encounter (Signed)
Pt had tb test (blood draw) in July 2018. Pt found out that it was not covered by her insurance. The insurance told patient to see if we could file it a different way (maybe code it differently). She came in to have it done due to getting a job and she had a positive one a few years back. Please return call to discuss further [phone]

## 2017-09-19 NOTE — Telephone Encounter (Signed)
Spoke with Dr. Ancil Boozer and we will have [facility] use diagnosis code R76.11, which is Nonspecific reaction to tuberculin skin test without active tuberculosis. 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  Called and left voice message on 09/19/17 @ 4:46pm stating that another code have been sent to [facility].

## 2017-09-21 ENCOUNTER — Ambulatory Visit (INDEPENDENT_AMBULATORY_CARE_PROVIDER_SITE_OTHER): Payer: BLUE CROSS/BLUE SHIELD

## 2017-09-21 DIAGNOSIS — E538 Deficiency of other specified B group vitamins: Secondary | ICD-10-CM | POA: Diagnosis not present

## 2017-09-21 MED ORDER — CYANOCOBALAMIN 1000 MCG/ML IJ SOLN
1000.0000 ug | Freq: Once | INTRAMUSCULAR | Status: AC
  Administered 2017-09-21: 1000 ug via INTRAMUSCULAR

## 2017-10-02 ENCOUNTER — Telehealth: Payer: Self-pay | Admitting: Family Medicine

## 2017-10-02 DIAGNOSIS — E8881 Metabolic syndrome: Secondary | ICD-10-CM

## 2017-10-02 DIAGNOSIS — E669 Obesity, unspecified: Secondary | ICD-10-CM

## 2017-10-02 NOTE — Telephone Encounter (Signed)
Pt has a question about her Ozempic. She is requesting a return call.

## 2017-10-02 NOTE — Telephone Encounter (Signed)
Refill request for general medication: Ozempic  Last office visit:08/22/2017  Last physical exam: 02/22/2016  Follow up appointment:  None   Patient husband will be switching jobs soon and she would like to have a 90 day supply of her Ozempic before the insurance changes. She also needs test strips refilled.

## 2017-10-02 NOTE — Telephone Encounter (Deleted)
Refill request for general medication:  Last office visit:  Last physical exam:

## 2017-10-03 ENCOUNTER — Other Ambulatory Visit: Payer: Self-pay | Admitting: Family Medicine

## 2017-10-03 DIAGNOSIS — E8881 Metabolic syndrome: Secondary | ICD-10-CM

## 2017-10-03 DIAGNOSIS — E669 Obesity, unspecified: Secondary | ICD-10-CM

## 2017-10-03 MED ORDER — GLUCOSE BLOOD VI STRP: ORAL_STRIP | 12 refills | Status: DC

## 2017-10-03 MED ORDER — SEMAGLUTIDE(0.25 OR 0.5MG/DOS) 2 MG/1.5ML ~~LOC~~ SOPN: 0.5000 mg | PEN_INJECTOR | SUBCUTANEOUS | 0 refills | Status: DC

## 2017-10-03 NOTE — Telephone Encounter (Signed)
Sent 90 day supply. She may want to return for follow up prior to losing insurance.

## 2017-10-03 NOTE — Telephone Encounter (Signed)
Called patient to let her know that 45- day supply for Ozempic was sent to pharmacy. She also wants to know if you will send her in something for anxiety. She said that the two of you discussed her anxiety at her last visit and she declined medications at that time. She thinks that she does want to try a low dose anxiety medication.

## 2017-10-03 NOTE — Telephone Encounter (Signed)
Unfortunately I did not document our conversation about anxiety or noted any options on treatment . Please ask her to follow up Thank you

## 2017-10-03 NOTE — Telephone Encounter (Signed)
Called patient and left vm for her to call and schedule an appointment to discuss anxiety medication.

## 2017-10-05 MED ORDER — GLUCOSE BLOOD VI STRP: ORAL_STRIP | 12 refills | Status: DC

## 2017-10-05 NOTE — Addendum Note (Signed)
Addended by: Chilton Greathouse on: 10/05/2017 09:39 AM   Modules accepted: Orders

## 2017-10-05 NOTE — Telephone Encounter (Signed)
Insurance will not cover the brand name test strips. Pharmacy suggest that we reorder generic and see if it gets approved.

## 2017-10-09 ENCOUNTER — Ambulatory Visit: Payer: BLUE CROSS/BLUE SHIELD | Admitting: Family Medicine

## 2017-10-10 ENCOUNTER — Ambulatory Visit (INDEPENDENT_AMBULATORY_CARE_PROVIDER_SITE_OTHER): Payer: Self-pay | Admitting: Family Medicine

## 2017-10-10 ENCOUNTER — Encounter: Payer: Self-pay | Admitting: Family Medicine

## 2017-10-10 VITALS — BP 102/72 | HR 91 | Temp 97.8°F | Resp 16 | Ht 65.0 in | Wt 180.5 lb

## 2017-10-10 DIAGNOSIS — E559 Vitamin D deficiency, unspecified: Secondary | ICD-10-CM

## 2017-10-10 DIAGNOSIS — Z630 Problems in relationship with spouse or partner: Secondary | ICD-10-CM

## 2017-10-10 DIAGNOSIS — E538 Deficiency of other specified B group vitamins: Secondary | ICD-10-CM

## 2017-10-10 DIAGNOSIS — E785 Hyperlipidemia, unspecified: Secondary | ICD-10-CM

## 2017-10-10 DIAGNOSIS — Z79899 Other long term (current) drug therapy: Secondary | ICD-10-CM

## 2017-10-10 DIAGNOSIS — E669 Obesity, unspecified: Secondary | ICD-10-CM

## 2017-10-10 DIAGNOSIS — F419 Anxiety disorder, unspecified: Secondary | ICD-10-CM

## 2017-10-10 DIAGNOSIS — E8881 Metabolic syndrome: Secondary | ICD-10-CM

## 2017-10-10 DIAGNOSIS — G35 Multiple sclerosis: Secondary | ICD-10-CM

## 2017-10-10 MED ORDER — CONTOUR NEXT MONITOR W/DEVICE KIT: 1.0000 | PACK | Freq: Every day | 0 refills | Status: DC

## 2017-10-10 MED ORDER — MICROLET LANCETS MISC: 1.0000 | Freq: Every day | 2 refills | Status: DC

## 2017-10-10 MED ORDER — GLUCOSE BLOOD VI STRP: ORAL_STRIP | 12 refills | Status: DC

## 2017-10-10 MED ORDER — ALPRAZOLAM 0.5 MG PO TBDP: 0.5000 mg | ORAL_TABLET | Freq: Every day | ORAL | 0 refills | Status: DC | PRN

## 2017-10-10 MED ORDER — CITALOPRAM HYDROBROMIDE 20 MG PO TABS: 20.0000 mg | ORAL_TABLET | Freq: Every day | ORAL | 2 refills | Status: DC

## 2017-10-10 NOTE — Progress Notes (Signed)
Name: Hannah Terry   MRN: [id number]    DOB: 07-20-1971   Date:10/10/2017       Progress Note  Subjective  Chief Complaint  Chief Complaint  Patient presents with  . Anxiety  . Obesity    HPI  Post-coital bleeding: her husband cheated on her in 2011 and she is not sure if it is happening again, he is currently not working ( injury back in 12/2016) , thinking about separation, since weight loss she started to spot again and over the past three weeks she has noticed bleeding during and after intercourse also has some pain during sex. She was referred to GYN and needs to keep appointment   Vitamin D deficiency and B12: labs done in 12/2016 by Dr. Manuella Ghazi, she is taking oral supplementation and gets B12 injections  Obesity: she has insulin resistance, changed diet and was started on Ozempic 01/2017 , initial weight was 213 lbs, and she is down 33 lbs. She is doing well,   Panic attack and anxiety:  she has anxiety, GAD was high and last visit had a normal spirometry, however had to move her sister to [place] and after that move to her sister's house here in [place], she has noticed increase in anxiety and is associated with chest heaviness and SOB, , no calf pain. She thinks it is worse when anxious, symptoms are intermittent, no cough, or fever. She came in today to try medication. We will try Celexa and also add alprazolam to take prn panic attack. Symptoms started when husband got injured at work back in 12/2016 and has not been working.   MS: she is still seeing neurologist and compliant with medication, no recent flares.   Dyslipidemia: discussed life style modification.   Patient Active Problem List   Diagnosis Date Noted  . Allergic rhinitis, seasonal 05/08/2017  . Vitamin D deficiency 02/07/2017  . Acanthosis nigricans 02/22/2016  . 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 Obesity (BMI 30.0-34.9) 01/12/2016  . MS (multiple sclerosis) ([place]) 01/12/2016  . B12 deficiency 12/23/2015  .  IUD contraception 04/02/2015    History reviewed. No pertinent surgical history.  Family History  Problem Relation Age of Onset  . Alcohol abuse Mother   . Alcohol abuse Father   . Drug abuse Father     Social History   Social History  . Marital status: Married    Spouse name: N/A  . Number of children: N/A  . Years of education: N/A   Occupational History  . teacher [employer]   Social History Main Topics  . Smoking status: Never Smoker  . Smokeless tobacco: Never Used  . Alcohol use No  . Drug use: No  . Sexual activity: Yes   Other Topics Concern  . Not on file   Social History Narrative   Married for 21 years, has 4 children, and her youngest son knows her husband is cheating on her      Current Outpatient Prescriptions:  .  Azelastine-Fluticasone (DYMISTA) 137-50 MCG/ACT SUSP, Place 1 spray into the nose 2 (two) times daily., Disp: 23 g, Rfl: 2 .  Cholecalciferol (VITAMIN D) 2000 units CAPS, Take 1 capsule (2,000 Units total) by mouth daily., Disp: 30 capsule, Rfl: 0 .  Dimethyl Fumarate (TECFIDERA) 240 MG CPDR, Take by mouth., Disp: , Rfl:  .  levocetirizine (XYZAL) 5 MG tablet, Take 1 tablet (5 mg total) by mouth every evening., Disp: 30 tablet, Rfl: 2 .  levonorgestrel (MIRENA, 52 MG,)  20 MCG/24HR IUD, 1 each by Intrauterine route., Disp: , Rfl:  .  montelukast (SINGULAIR) 10 MG tablet, Take 1 tablet (10 mg total) by mouth at bedtime., Disp: 30 tablet, Rfl: 3 .  naproxen (NAPROSYN) 500 MG tablet, take 1 tablet by mouth twice a day with A MEAL, Disp: 30 tablet, Rfl: 0 .  Semaglutide (OZEMPIC) 0.25 or 0.5 MG/DOSE SOPN, Inject 0.5 mg into the skin once a week. For the first two weeks after that 0.5 mg weekly, Disp: 6 pen, Rfl: 0 .  ALPRAZolam (NIRAVAM) 0.5 MG dissolvable tablet, Take 1 tablet (0.5 mg total) by mouth daily as needed for anxiety. Panic attack, Disp: 10 tablet, Rfl: 0 .  Blood Glucose Monitoring Suppl (CONTOUR NEXT MONITOR)  w/Device KIT, 1 kit by Does not apply route daily., Disp: 1 kit, Rfl: 0 .  citalopram (CELEXA) 20 MG tablet, Take 1 tablet (20 mg total) by mouth daily., Disp: 30 tablet, Rfl: 2 .  glucose blood (CONTOUR NEXT TEST) test strip, Use as instructed, Disp: 100 each, Rfl: 12 .  MICROLET LANCETS MISC, 1 each by Does not apply route daily., Disp: 100 each, Rfl: 2  No Known Allergies   ROS  Constitutional: Negative for fever , positive for weight change.  Respiratory: Negative for cough and shortness of breath.   Cardiovascular: Negative for chest pain or palpitations. She has anxiety and causes chest pain intermittently  Gastrointestinal: Negative for abdominal pain, no bowel changes.  Musculoskeletal: Negative for gait problem or joint swelling.  Skin: Negative for rash.  Neurological: Negative for dizziness or headache.  No other specific complaints in a complete review of systems (except as listed in HPI above).  Objective  Vitals:   10/10/17 1136  BP: 102/72  Pulse: 91  Resp: 16  Temp: 97.8 F (36.6 C)  TempSrc: Oral  SpO2: 98%  Weight: 180 lb 8 oz (81.9 kg)  Height: 5' 5"  (1.651 m)    Body mass index is 30.04 kg/m.  Physical Exam  Constitutional: Patient appears well-developed and well-nourished. Obese No distress.  HEENT: head atraumatic, normocephalic, pupils equal and reactive to light,  neck supple, throat within normal limits Cardiovascular: Normal rate, regular rhythm and normal heart sounds.  No murmur heard. No BLE edema. Pulmonary/Chest: Effort normal and breath sounds normal. No respiratory distress. Abdominal: Soft.  There is no tenderness. Psychiatric: Patient has a normal mood and affect. behavior is normal. Judgment and thought content normal.   Recent Results (from the past 2160 hour(s))  Quantiferon tb gold assay (blood)     Status: Abnormal   Collection Time: 07/13/17  9:58 AM  Result Value Ref Range   Interferon Gamma Release Assay POSITIVE (A)  NEGATIVE    Comment: In healthy persons who have a low likelihood both of M. tuberculosis infection and of progression to active tuberculosis if infected, a single positive QFT result should not be taken as reliable evidence of M. tuberculosis infection. Repeat testing, with either the initial test or a different test, may be considered on a case-by-case basis.    Quantiferon Nil Value 0.05 IU/mL   Mitogen-Nil >10.00 IU/mL   Quantiferon Tb Ag Minus Nil Value 8.27 IU/mL    Comment:   The Nil tube value is used to determine if the patient has a preexisting immune response which could cause a false-positive reading on the test. In order for a test to be valid, the Nil tube must have a value of less than or equal to 8.0  IU/mL.   The mitogen control tube is used to assure the patient has a healthy immune status and also serves as a control for correct blood handling and incubation. It is used to detect false-negative readings. The mitogen tube must have a gamma interferon value of greater than or equal to 0.5 IU/mL higher than the value of the Nil tube.   The TB antigen tube is coated with the M. tuberculosis specific antigens. For a test to be considered positive, the TB antigen tube value minus the Nil tube value must be greater than or equal to 0.35 IU/mL.   For additional information, please refer to http://education.[facility].com/faq/QFT (This link is being provided for informational/educational purposes only.)   Varicella zoster antibody, IgG     Status: Abnormal   Collection Time: 07/13/17 11:06 AM  Result Value Ref Range   Varicella IgG 1,557.00 (H) <135.00 Index    Comment:        Index           Interpretation      =====           ==============      < 135.00          Negative      135.00-164.99     Equivocal      >= 165.00         Positive   A positive result indicates that the patient has antibody to VZV but does not differentiate between an active or past  infection. The clinical diagnosis must be interpreted in conjunction with the clinical signs and symptoms of the patient. This assay reliably measures immunity due to previous infection but may not be sensitive enough to detect antibodies induced by vaccination. Thus, a negative result in a vaccinated individual does not necessarily indicate susceptibility to VZV infection.     Measles/Mumps/Rubella Immunity     Status: Abnormal   Collection Time: 07/13/17 11:06 AM  Result Value Ref Range   Rubella 5.08 (H) <0.90 Index    Comment:                  Index          Interpretation                =====          ==============                <0.90          Not consistent with immunity                0.90-0.99      Equivocal                >=1.00         Consistent with immunity   The presence of Rubella IgG antibody suggests immunization or past or current infection with Rubella virus.    Mumps IgG 29.50 (H) <9.00 AU/mL    Comment:        AU/mL                Interpretation      =====                ==============      < 9.00               Negative      9.00 - 10.99         Equivocal      >  10.99              Positive   A positive result indicates that the patient has antibody to mumps virus. It does not differentiate between an active or past infection. The clinical diagnosis must be interpreted in conjunction with clinical signs and symptoms of the patient.    Rubeola IgG 210.00 (H) <25.00 AU/mL    Comment:        AU/mL                Interpretation      =====                ==============      < 25.00              Negative      25.00 - 29.99        Equivocal      > 29.99              Positive   A positive result indicates that the patient has antibody to measles virus. It does not differentiate between an active or past infection. The clinical diagnosis must be interpreted in conjunction with clinical signs and symptoms of the patient.   GC/Chlamydia Probe Amp      Status: None   Collection Time: 08/22/17  3:37 PM  Result Value Ref Range   CT Probe RNA NOT DETECTED     Comment:                    **Normal Reference Range: NOT DETECTED**   This test was performed using the APTIMA COMBO2 Assay ([employer]   The analytical performance characteristics of this assay, when used to test SurePath specimens have been determined by [facility]      GC Probe RNA NOT DETECTED     Comment:                    **Normal Reference Range: NOT DETECTED**   This test was performed using the APTIMA COMBO2 Assay ([employer].).   The analytical performance characteristics of this assay, when used to test SurePath specimens have been determined by [facility]         PHQ2/9: Depression screen [facility] 2/9 10/10/2017 02/07/2017 02/22/2016 12/23/2015  Decreased Interest 0 0 0 0  Down, Depressed, Hopeless 0 0 0 0  PHQ - 2 Score 0 0 0 0    Fall Risk: Fall Risk  10/10/2017 02/07/2017 02/22/2016 12/23/2015  Falls in the past year? No No No No     Functional Status Survey: Is the patient deaf or have difficulty hearing?: No Does the patient have difficulty seeing, even when wearing glasses/contacts?: No Does the patient have difficulty concentrating, remembering, or making decisions?: No Does the patient have difficulty walking or climbing stairs?: No Does the patient have difficulty dressing or bathing?: No Does the patient have difficulty doing errands alone such as visiting a doctor's office or shopping?: No    Assessment & Plan  1. Insulin resistance  - Blood Glucose Monitoring Suppl (CONTOUR NEXT MONITOR) w/Device KIT; 1 kit by Does not apply route daily.  Dispense: 1 kit; Refill: 0 - MICROLET LANCETS MISC; 1 each by Does not apply route daily.  Dispense: 100 each; Refill: 2 - glucose blood (CONTOUR NEXT TEST) test strip; Use as instructed  Dispense: 100 each; Refill: 12 - Insulin, random   2. B12 deficiency  Continue supplementation  3. Vitamin D deficiency   4. Obesity (BMI 30.0-34.9)  Doing well on Ozempic   5. Anxiety  - citalopram (CELEXA) 20 MG tablet; Take 1 tablet (20 mg total) by mouth daily.  Dispense: 30 tablet; Refill: 2 - ALPRAZolam (NIRAVAM) 0.5 MG dissolvable tablet; Take 1 tablet (0.5 mg total) by mouth daily as needed for anxiety. Panic attack  Dispense: 10 tablet; Refill: 0  6. MS (multiple sclerosis) ([place])  Continue medication   7. Dyslipidemia  - Lipid panel  8. Encounter for long-term (current) use of medications  - COMPLETE METABOLIC PANEL WITH GFR  9. Marital problem  She will personal counseling , thinking about leaving him

## 2017-10-13 LAB — COMPLETE METABOLIC PANEL WITH GFR
AG Ratio: 1.4 (calc) (ref 1.0–2.5)
ALBUMIN MSPROF: 3.9 g/dL (ref 3.6–5.1)
ALT: 8 U/L (ref 6–29)
AST: 10 U/L (ref 10–30)
Alkaline phosphatase (APISO): 75 U/L (ref 33–115)
BUN: 11 mg/dL (ref 7–25)
CALCIUM: 8.5 mg/dL — AB (ref 8.6–10.2)
CO2: 27 mmol/L (ref 20–32)
CREATININE: 0.85 mg/dL (ref 0.50–1.10)
Chloride: 105 mmol/L (ref 98–110)
GFR, EST AFRICAN AMERICAN: 97 mL/min/{1.73_m2} (ref >=60)
GFR, EST NON AFRICAN AMERICAN: 83 mL/min/{1.73_m2} (ref >=60)
GLOBULIN: 2.8 g/dL (ref 1.9–3.7)
Glucose, Bld: 83 mg/dL (ref 65–99)
Potassium: 3.8 mmol/L (ref 3.5–5.3)
SODIUM: 139 mmol/L (ref 135–146)
Total Bilirubin: 0.6 mg/dL (ref 0.2–1.2)
Total Protein: 6.7 g/dL (ref 6.1–8.1)

## 2017-10-13 LAB — LIPID PANEL
CHOLESTEROL: 174 mg/dL (ref <200)
HDL: 42 mg/dL — ABNORMAL LOW (ref >50)
LDL Cholesterol (Calc): 117 mg/dL (calc) — ABNORMAL HIGH
Non-HDL Cholesterol (Calc): 132 mg/dL (calc) — ABNORMAL HIGH (ref <130)
Total CHOL/HDL Ratio: 4.1 (calc) (ref <5.0)
Triglycerides: 49 mg/dL (ref <150)

## 2017-10-13 LAB — INSULIN, RANDOM: Insulin: 13.2 u[IU]/mL (ref 2.0–19.6)

## 2017-11-22 ENCOUNTER — Ambulatory Visit: Payer: BLUE CROSS/BLUE SHIELD | Admitting: Family Medicine

## 2018-01-10 ENCOUNTER — Encounter: Payer: Self-pay | Admitting: Family Medicine

## 2018-01-10 ENCOUNTER — Ambulatory Visit (INDEPENDENT_AMBULATORY_CARE_PROVIDER_SITE_OTHER): Payer: 59 | Admitting: Family Medicine

## 2018-01-10 VITALS — BP 86/48 | HR 82 | Resp 14 | Ht 65.0 in | Wt 184.4 lb

## 2018-01-10 DIAGNOSIS — F419 Anxiety disorder, unspecified: Secondary | ICD-10-CM | POA: Diagnosis not present

## 2018-01-10 DIAGNOSIS — G35 Multiple sclerosis: Secondary | ICD-10-CM

## 2018-01-10 DIAGNOSIS — E669 Obesity, unspecified: Secondary | ICD-10-CM

## 2018-01-10 DIAGNOSIS — M5412 Radiculopathy, cervical region: Secondary | ICD-10-CM

## 2018-01-10 DIAGNOSIS — E8881 Metabolic syndrome: Secondary | ICD-10-CM | POA: Diagnosis not present

## 2018-01-10 DIAGNOSIS — H539 Unspecified visual disturbance: Secondary | ICD-10-CM

## 2018-01-10 MED ORDER — CITALOPRAM HYDROBROMIDE 20 MG PO TABS: 20.0000 mg | ORAL_TABLET | Freq: Every day | ORAL | 2 refills | Status: DC

## 2018-01-10 NOTE — Progress Notes (Signed)
Name: Hannah Terry   MRN: [id number]    DOB: 30-Apr-1971   Date:01/10/2018       Progress Note  Subjective  Chief Complaint  Chief Complaint  Patient presents with  . Obesity  . Hyperglycemia  . Multiple Sclerosis    HPI    Obesity: she has insulin resistance, changed diet and was started on Ozempic 01/2017 , initial weight was 213 lbs, and she was  down 33 lbs, however had a gap in insurance and had to go down on dose to 0.25 mg for the past 6 weeks, she has gained 3 lbs since, but still responding to medication. She has insurance again and we will go up to 0.5 mg now.   Change in vision :she has noticed that she needs to hold papers further away to be able to read, last eye exam over 2 years ago no other significant complains, advised follow up with eye doctor   Panic attack and anxiety:  she has  GAD, started on Citalopram 09/2017 after episode of panic attack and is doing well at this time, no problem sleeping, denies chest pain.   MS: she is still seeing neurologist and compliant with medication,  She is due for follow up with neurologist and has noticed tingling on left arm when she flexes her head to left side, sometimes gets uncomfortable going to bed because of it, no arm weakness.    Dyslipidemia: discussed life style modification.   Post-coital bleeding: her husband cheated on her in 2011 and she is not sure if it is happening again, he is currently not working ( injury back in 12/2016) , thinking about separation, since weight loss she started to spot again and over the past three weeks she has noticed bleeding during and after intercourse also has some pain during sex. She was seen by gyn and is doing well, no problems at this time   Patient Active Problem List   Diagnosis Date Noted  . Allergic rhinitis, seasonal 05/08/2017  . Vitamin D deficiency 02/07/2017  . Acanthosis nigricans 02/22/2016  . 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 Obesity (BMI 30.0-34.9) 01/12/2016  . MS  (multiple sclerosis) ([place]) 01/12/2016  . B12 deficiency 12/23/2015  . IUD contraception 04/02/2015    History reviewed. No pertinent surgical history.  Family History  Problem Relation Age of Onset  . Alcohol abuse Mother   . Alcohol abuse Father   . Drug abuse Father     Social History   Socioeconomic History  . Marital status: Married    Spouse name: Not on file  . Number of children: Not on file  . Years of education: Not on file  . Highest education level: Not on file  Social Needs  . Financial resource strain: Not on file  . Food insecurity - worry: Not on file  . Food insecurity - inability: Not on file  . Transportation needs - medical: Not on file  . Transportation needs - non-medical: Not on file  Occupational History  . Occupation: Research scientist (life sciences)     Employer: [employer]  Tobacco Use  . Smoking status: Never Smoker  . Smokeless tobacco: Never Used  Substance and Sexual Activity  . Alcohol use: No    Alcohol/week: 0.0 oz  . Drug use: No  . Sexual activity: Yes  Other Topics Concern  . Not on file  Social History Narrative   Married for 21 years, has 4 children, and her youngest son knows her husband is  cheating on her      Current Outpatient Medications:  .  ALPRAZolam (NIRAVAM) 0.5 MG dissolvable tablet, Take 1 tablet (0.5 mg total) by mouth daily as needed for anxiety. Panic attack, Disp: 10 tablet, Rfl: 0 .  Azelastine-Fluticasone (DYMISTA) 137-50 MCG/ACT SUSP, Place 1 spray into the nose 2 (two) times daily., Disp: 23 g, Rfl: 2 .  Blood Glucose Monitoring Suppl (CONTOUR NEXT MONITOR) w/Device KIT, 1 kit by Does not apply route daily., Disp: 1 kit, Rfl: 0 .  Cholecalciferol (VITAMIN D) 2000 units CAPS, Take 1 capsule (2,000 Units total) by mouth daily., Disp: 30 capsule, Rfl: 0 .  citalopram (CELEXA) 20 MG tablet, Take 1 tablet (20 mg total) by mouth daily., Disp: 30 tablet, Rfl: 2 .  glucose blood (CONTOUR NEXT TEST) test strip, Use  as instructed, Disp: 100 each, Rfl: 12 .  levocetirizine (XYZAL) 5 MG tablet, Take 1 tablet (5 mg total) by mouth every evening., Disp: 30 tablet, Rfl: 2 .  levonorgestrel (MIRENA, 52 MG,) 20 MCG/24HR IUD, 1 each by Intrauterine route., Disp: , Rfl:  .  MICROLET LANCETS MISC, 1 each by Does not apply route daily., Disp: 100 each, Rfl: 2 .  montelukast (SINGULAIR) 10 MG tablet, Take 1 tablet (10 mg total) by mouth at bedtime., Disp: 30 tablet, Rfl: 3 .  Semaglutide (OZEMPIC) 0.25 or 0.5 MG/DOSE SOPN, Inject 0.5 mg into the skin once a week. For the first two weeks after that 0.5 mg weekly, Disp: 6 pen, Rfl: 0 .  Dimethyl Fumarate (TECFIDERA) 240 MG CPDR, Take by mouth., Disp: , Rfl:   No Known Allergies   ROS  Constitutional: Negative for fever or weight change.  Respiratory: Negative for cough and shortness of breath.   Cardiovascular: Negative for chest pain or palpitations.  Gastrointestinal: Negative for abdominal pain, no bowel changes.  Musculoskeletal: Negative for gait problem or joint swelling.  Skin: Negative for rash.  Neurological: Negative for dizziness or headache.  No other specific complaints in a complete review of systems (except as listed in HPI above).  Objective  Vitals:   01/10/18 1010  BP: (!) 86/48  Pulse: 82  Resp: 14  SpO2: 99%  Weight: 184 lb 6.4 oz (83.6 kg)  Height: _0  (1.651 m)    Body mass index is 30.69 kg/m.  Physical Exam  Constitutional: Patient appears well-developed and well-nourished. Obese  No distress.  HEENT: head atraumatic, normocephalic, pupils equal and reactive to light,  neck supple, throat within normal limits Cardiovascular: Normal rate, regular rhythm and normal heart sounds.  No murmur heard. No BLE edema. Pulmonary/Chest: Effort normal and breath sounds normal. No respiratory distress. Abdominal: Soft.  There is no tenderness. Psychiatric: Patient has a normal mood and affect. behavior is normal. Judgment and thought  content normal. Neurological: no focal findings, normal sensation of both arms, normal cranial nerves, normal grip  PHQ2/9: Depression screen [facility] 2/9 10/10/2017 02/07/2017 02/22/2016 12/23/2015  Decreased Interest 0 0 0 0  Down, Depressed, Hopeless 0 0 0 0  PHQ - 2 Score 0 0 0 0     Fall Risk: Fall Risk  01/10/2018 10/10/2017 02/07/2017 02/22/2016 12/23/2015  Falls in the past year? _1     Functional Status Survey: Is the patient deaf or have difficulty hearing?: No Does the patient have difficulty seeing, even when wearing glasses/contacts?: No Does the patient have difficulty concentrating, remembering, or making decisions?: No Does the patient have difficulty walking or climbing stairs?:  No Does the patient have difficulty dressing or bathing?: No Does the patient have difficulty doing errands alone such as visiting a doctor's office or shopping?: No    Assessment & Plan  1. Anxiety  - citalopram (CELEXA) 20 MG tablet; Take 1 tablet (20 mg total) by mouth daily.  Dispense: 30 tablet; Refill: 2 She is doing well, only took one [place] in the past 3 months   2. Obesity (BMI 30.0-34.9)  Discussed with the patient the risk posed by an increased BMI. Discussed importance of portion control, calorie counting and at least 150 minutes of physical activity weekly. Avoid sweet beverages and drink more water. Eat at least 6 servings of fruit and vegetables daily   3. Insulin resistance  She has been stretching the Ozempic, still at 0.25 mg weekly and wants to go up on the dose  4. Change in vision  Discussed importance of seeing eye doctor asap, she states she has insurance. Explained risk of retinopathy with Ozempic, but based on her description it seems to be presbyopia.   6. Radiculitis of left cervical region  Discussed follow up with neurologist since she has MS

## 2018-04-17 ENCOUNTER — Ambulatory Visit: Payer: 59 | Admitting: Family Medicine

## 2018-04-17 ENCOUNTER — Encounter: Payer: Self-pay | Admitting: Family Medicine

## 2018-04-17 VITALS — BP 86/46 | HR 96 | Temp 98.0°F | Resp 16 | Ht 65.0 in | Wt 184.8 lb

## 2018-04-17 DIAGNOSIS — F419 Anxiety disorder, unspecified: Secondary | ICD-10-CM | POA: Diagnosis not present

## 2018-04-17 DIAGNOSIS — E8881 Metabolic syndrome: Secondary | ICD-10-CM

## 2018-04-17 DIAGNOSIS — E559 Vitamin D deficiency, unspecified: Secondary | ICD-10-CM

## 2018-04-17 DIAGNOSIS — J301 Allergic rhinitis due to pollen: Secondary | ICD-10-CM

## 2018-04-17 DIAGNOSIS — G35 Multiple sclerosis: Secondary | ICD-10-CM | POA: Diagnosis not present

## 2018-04-17 DIAGNOSIS — E538 Deficiency of other specified B group vitamins: Secondary | ICD-10-CM | POA: Diagnosis not present

## 2018-04-17 DIAGNOSIS — E785 Hyperlipidemia, unspecified: Secondary | ICD-10-CM | POA: Diagnosis not present

## 2018-04-17 DIAGNOSIS — E669 Obesity, unspecified: Secondary | ICD-10-CM

## 2018-04-17 DIAGNOSIS — E88819 Insulin resistance, unspecified: Secondary | ICD-10-CM

## 2018-04-17 DIAGNOSIS — G35D Multiple sclerosis, unspecified: Secondary | ICD-10-CM

## 2018-04-17 DIAGNOSIS — E66811 Obesity, class 1: Secondary | ICD-10-CM

## 2018-04-17 MED ORDER — CYANOCOBALAMIN 1000 MCG/ML IJ SOLN
1000.0000 ug | Freq: Once | INTRAMUSCULAR | Status: AC
  Administered 2018-04-17: 1000 ug via INTRAMUSCULAR

## 2018-04-17 MED ORDER — CITALOPRAM HYDROBROMIDE 20 MG PO TABS: 20.0000 mg | ORAL_TABLET | Freq: Every day | ORAL | 2 refills | Status: DC

## 2018-04-17 MED ORDER — SEMAGLUTIDE(0.25 OR 0.5MG/DOS) 2 MG/1.5ML ~~LOC~~ SOPN: 0.5000 mg | PEN_INJECTOR | SUBCUTANEOUS | 2 refills | Status: DC
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
## 2018-08-09 ENCOUNTER — Telehealth: Payer: Self-pay

## 2018-08-09 ENCOUNTER — Telehealth: Payer: Self-pay | Admitting: Family Medicine

## 2018-08-09 NOTE — Telephone Encounter (Signed)
I looked back in our old records with [employer] and was unable to find a Hep. B Series. Informed patient to check her Employee Health at work or GYN to see if she had any vaccines with them.

## 2018-08-09 NOTE — Telephone Encounter (Signed)
Please advise 

## 2018-08-09 NOTE — Telephone Encounter (Signed)
I am sorry, not at this time.  It is flu season and we just have one left for new starts

## 2018-08-09 NOTE — Telephone Encounter (Signed)
Copied from [place] [phone]. Topic: Quick Communication - Rx Refill/Question >> Aug 09, 2018 11:45 AM Hannah Terry wrote: Medication: Semaglutide (OZEMPIC) 0.25 or 0.5 MG/DOSE SOPN  Pt has one dose left and doesn't have the funds to get refill at this time.  She wants to know if she can get samples

## 2018-08-09 NOTE — Telephone Encounter (Signed)
Copied from [place] [id number]. Topic: Inquiry >> Aug 09, 2018 11:43 AM Hannah Terry wrote: Reason for CRM:  Pt wants to know if she has had the Hep B vaccine.  Pt can be reached at [phone]

## 2018-08-10 NOTE — Telephone Encounter (Signed)
Patient was informed and said "ok, fine thank you."

## 2018-08-17 ENCOUNTER — Other Ambulatory Visit (HOSPITAL_COMMUNITY)
Admission: RE | Admit: 2018-08-17 | Discharge: 2018-08-17 | Disposition: A | Discharge status: Home / Self Care | Payer: 59 | Source: Ambulatory Visit | Attending: Family Medicine | Admitting: Family Medicine

## 2018-08-17 ENCOUNTER — Ambulatory Visit: Payer: 59 | Admitting: Family Medicine

## 2018-08-17 ENCOUNTER — Encounter: Payer: Self-pay | Admitting: Family Medicine

## 2018-08-17 VITALS — BP 106/64 | HR 76 | Temp 97.8°F | Resp 16 | Ht 64.25 in | Wt 186.6 lb

## 2018-08-17 DIAGNOSIS — E538 Deficiency of other specified B group vitamins: Secondary | ICD-10-CM | POA: Diagnosis not present

## 2018-08-17 DIAGNOSIS — G35 Multiple sclerosis: Secondary | ICD-10-CM

## 2018-08-17 DIAGNOSIS — E66811 Obesity, class 1: Secondary | ICD-10-CM

## 2018-08-17 DIAGNOSIS — Z01419 Encounter for gynecological examination (general) (routine) without abnormal findings: Secondary | ICD-10-CM

## 2018-08-17 DIAGNOSIS — E88819 Insulin resistance, unspecified: Secondary | ICD-10-CM

## 2018-08-17 DIAGNOSIS — Z113 Encounter for screening for infections with a predominantly sexual mode of transmission: Secondary | ICD-10-CM

## 2018-08-17 DIAGNOSIS — Z124 Encounter for screening for malignant neoplasm of cervix: Secondary | ICD-10-CM

## 2018-08-17 DIAGNOSIS — Z1231 Encounter for screening mammogram for malignant neoplasm of breast: Secondary | ICD-10-CM | POA: Diagnosis not present

## 2018-08-17 DIAGNOSIS — Z23 Encounter for immunization: Secondary | ICD-10-CM

## 2018-08-17 DIAGNOSIS — E8881 Metabolic syndrome: Secondary | ICD-10-CM

## 2018-08-17 DIAGNOSIS — E559 Vitamin D deficiency, unspecified: Secondary | ICD-10-CM

## 2018-08-17 DIAGNOSIS — E669 Obesity, unspecified: Secondary | ICD-10-CM | POA: Diagnosis not present

## 2018-08-17 DIAGNOSIS — E785 Hyperlipidemia, unspecified: Secondary | ICD-10-CM

## 2018-08-17 MED ORDER — CYANOCOBALAMIN 1000 MCG/ML IJ SOLN
1000.0000 ug | Freq: Once | INTRAMUSCULAR | Status: AC
  Administered 2018-08-17: 1000 ug via INTRAMUSCULAR

## 2018-08-17 MED ORDER — LORCASERIN HCL ER 20 MG PO TB24: 1.0000 | ORAL_TABLET | ORAL | 2 refills | Status: DC
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
## 2018-08-22 LAB — CYTOLOGY - PAP
Bacterial vaginitis: NEGATIVE
Chlamydia: NEGATIVE
Diagnosis: NEGATIVE
HPV (WINDOPATH): NOT DETECTED
Neisseria Gonorrhea: NEGATIVE
Trichomonas: NEGATIVE

## 2018-09-03 ENCOUNTER — Ambulatory Visit: Payer: Self-pay | Admitting: *Deleted

## 2018-09-03 NOTE — Telephone Encounter (Signed)
Patient has been experiencing chest tightness for approximately one month that she reports is constant. Today she stated the chest pain worsens when she takes a deep breath. The pain in between her left breast and left shoulder.Denies dizziness/fever/weakness. When asked if it radiates she stated she feels pain on the left side of her neck, shoulder and her arm. She also stated she felt like she slept wrong 3-4 nights ago and that's when the neck, shoulder and arm pain started. She feels short of breath when she walks only. She rates the chest pain a 4 today. Stated the CP has been constant for the month and hasn't worsened in pain when she takes a deep breath. Advised her to seek immediate treatment at the ER. Stated she would go to ER at some point today or this evening.  Reason for Disposition . Taking a deep breath makes pain worse . [1] Chest pain lasts > 5 minutes AND [2] age > 31 AND [3] at least one cardiac risk factor (i.e., hypertension, diabetes, obesity, smoker or strong family history of heart disease)  Answer Assessment - Initial Assessment Questions 1. LOCATION: "Where does it hurt?"       Left side about 3 inches above breast. Left side of neck, into the top area of shoulder. 2. RADIATION: "Does the pain go anywhere else?" (e.g., into neck, jaw, arms, back)     Left side of neck  3. ONSET: "When did the chest pain begin?" (Minutes, hours or days)      About one month ago the chest tightness started. 4. PATTERN "Does the pain come and go, or has it been constant since it started?"  "Does it get worse with exertion?"     Gets worse with deep breathing. 5. DURATION: "How long does it last" (e.g., seconds, minutes, hours)     Constant  6. SEVERITY: "How bad is the pain?"  (e.g., Scale 1-10; mild, moderate, or severe)    - MILD (1-3): doesn't interfere with normal activities     - MODERATE (4-7): interferes with normal activities or awakens from sleep    - SEVERE (8-10): excruciating  pain, unable to do any normal activities       Mild, continues to work. 7. CARDIAC RISK FACTORS: "Do you have any history of heart problems or risk factors for heart disease?" (e.g., prior heart attack, angina; high blood pressure, diabetes, being overweight, high cholesterol, smoking, or strong family history of heart disease)     Overweight slightly 8. PULMONARY RISK FACTORS: "Do you have any history of lung disease?"  (e.g., blood clots in lung, asthma, emphysema, birth control pills)     No, IUD 9. CAUSE: "What do you think is causing the chest pain?"     Unsure but she slept wrong last week and the chest tightness seems to be worse since that occurred.  10. OTHER SYMPTOMS: "Do you have any other symptoms?" (e.g., dizziness, nausea, vomiting, sweating, fever, difficulty breathing, cough)       With walking she reports short of breath. 11. PREGNANCY: "Is there any chance you are pregnant?" "When was your last menstrual period?"       no  Protocols used: CHEST PAIN-A-AH

## 2018-09-04 NOTE — Telephone Encounter (Signed)
Please make sure she went to [facility]

## 2018-09-11 ENCOUNTER — Telehealth: Payer: Self-pay

## 2018-09-11 NOTE — Telephone Encounter (Signed)
Copied from [place] [id number]. Topic: Inquiry >> Sep 11, 2018  9:33 AM Rutherford Nail, NT wrote: Reason for CRM: Patient calling to see when her last hepatitis B vaccine was? States that it could have been before switching to the new system. Please advise. Patient would like a call back with this information. CB#: [phone]

## 2018-09-11 NOTE — Telephone Encounter (Signed)
I'm unable to find any Hep. B Vaccine in Epic nor Chiropractor. Can you double check behind me? I informed the patient of this on 08/09/2018, but she is calling back insisting she had it done here. Thanks so much.

## 2018-09-11 NOTE — Telephone Encounter (Signed)
There was an order for blood work that was placed but it seems like the labs was never drawn.

## 2018-09-12 NOTE — Telephone Encounter (Signed)
Patient informed we have no Hepatitis B vaccine on file just the titer to be drawn. Patient states the hospital will try the titer for free of charge so she will have it done there. She just wanted to cover her bases first. Also she wanted Dr. Ancil Boozer to know Melynda Keller is not covered by her Insurance.

## 2018-09-21 ENCOUNTER — Telehealth: Payer: Self-pay

## 2018-09-21 NOTE — Telephone Encounter (Signed)
Copied from [place] [id number]. Topic: General - Other >> Sep 21, 2018  3:27 PM Sheran Luz wrote: Reason for CRM: Pt is requesting a call back from [place] to discuss weightloss medication-states she has spoke to [place] regarding same medication.  Pts phone was breaking up and it was incredibly difficult to hear.

## 2018-09-21 NOTE — Telephone Encounter (Signed)
Patient Insurance denied Belviq until she has tried OTC Alli for 6 months of therapy and trying to lose 5% of total body weight. Patient states she has been taking Alli since 09/03/2018 and will continue for the 6 months but currently is not losing any weight.

## 2018-09-21 NOTE — Telephone Encounter (Signed)
Called to speak with patient about weight loss medication, however she did not answer the phone. Patient seen Dr. Ancil Boozer on 08/17/2018 and discussed trying Belviq.

## 2018-10-03 ENCOUNTER — Ambulatory Visit
Admission: RE | Admit: 2018-10-03 | Discharge: 2018-10-03 | Disposition: A | Discharge status: Home / Self Care | Payer: 59 | Source: Ambulatory Visit | Attending: Family Medicine | Admitting: Family Medicine

## 2018-10-03 DIAGNOSIS — Z1231 Encounter for screening mammogram for malignant neoplasm of breast: Secondary | ICD-10-CM

## 2018-10-05 ENCOUNTER — Ambulatory Visit: Payer: 59 | Admitting: Family Medicine

## 2019-07-11 ENCOUNTER — Ambulatory Visit: Payer: 59 | Admitting: Family Medicine

## 2019-07-11 ENCOUNTER — Encounter: Payer: Self-pay | Admitting: Family Medicine

## 2019-07-11 ENCOUNTER — Other Ambulatory Visit: Payer: Self-pay

## 2019-07-11 VITALS — BP 100/80 | HR 70 | Temp 97.3°F | Resp 16 | Ht 64.25 in | Wt 201.1 lb

## 2019-07-11 DIAGNOSIS — E538 Deficiency of other specified B group vitamins: Secondary | ICD-10-CM | POA: Diagnosis not present

## 2019-07-11 DIAGNOSIS — F419 Anxiety disorder, unspecified: Secondary | ICD-10-CM

## 2019-07-11 DIAGNOSIS — M5431 Sciatica, right side: Secondary | ICD-10-CM

## 2019-07-11 DIAGNOSIS — R079 Chest pain, unspecified: Secondary | ICD-10-CM

## 2019-07-11 DIAGNOSIS — E66811 Obesity, class 1: Secondary | ICD-10-CM

## 2019-07-11 DIAGNOSIS — Z202 Contact with and (suspected) exposure to infections with a predominantly sexual mode of transmission: Secondary | ICD-10-CM | POA: Diagnosis not present

## 2019-07-11 DIAGNOSIS — E785 Hyperlipidemia, unspecified: Secondary | ICD-10-CM

## 2019-07-11 DIAGNOSIS — G35 Multiple sclerosis: Secondary | ICD-10-CM

## 2019-07-11 DIAGNOSIS — E559 Vitamin D deficiency, unspecified: Secondary | ICD-10-CM

## 2019-07-11 DIAGNOSIS — E669 Obesity, unspecified: Secondary | ICD-10-CM

## 2019-07-11 MED ORDER — CYANOCOBALAMIN 1000 MCG/ML IJ SOLN
1000.0000 ug | Freq: Once | INTRAMUSCULAR | Status: AC
  Administered 2019-07-11: 1000 ug via INTRAMUSCULAR

## 2019-07-11 MED ORDER — HYDROXYZINE HCL 10 MG PO TABS: 10.0000 mg | ORAL_TABLET | Freq: Three times a day (TID) | ORAL | 0 refills | Status: DC | PRN

## 2019-07-11 MED ORDER — MELOXICAM 15 MG PO TABS: 15.0000 mg | ORAL_TABLET | Freq: Every day | ORAL | 0 refills | Status: DC

## 2019-07-11 MED ORDER — CYCLOBENZAPRINE HCL 10 MG PO TABS: 10.0000 mg | ORAL_TABLET | Freq: Every day | ORAL | 0 refills | Status: DC

## 2019-07-11 MED ORDER — CITALOPRAM HYDROBROMIDE 20 MG PO TABS: 20.0000 mg | ORAL_TABLET | Freq: Every day | ORAL | 1 refills | Status: DC
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
## 2019-07-11 NOTE — Patient Instructions (Signed)

## 2019-07-12 LAB — COMPLETE METABOLIC PANEL WITH GFR
AG Ratio: 1.4 (calc) (ref 1.0–2.5)
ALT: 15 U/L (ref 6–29)
AST: 15 U/L (ref 10–35)
Albumin: 4.1 g/dL (ref 3.6–5.1)
Alkaline phosphatase (APISO): 77 U/L (ref 31–125)
BILIRUBIN TOTAL: 0.5 mg/dL (ref 0.2–1.2)
BUN: 11 mg/dL (ref 7–25)
CALCIUM: 9.1 mg/dL (ref 8.6–10.2)
CHLORIDE: 107 mmol/L (ref 98–110)
CO2: 22 mmol/L (ref 20–32)
Creat: 0.77 mg/dL (ref 0.50–1.10)
GFR, EST NON AFRICAN AMERICAN: 93 mL/min/{1.73_m2} (ref >=60)
GFR, Est African American: 107 mL/min/{1.73_m2} (ref >=60)
GLOBULIN: 2.9 g/dL (ref 1.9–3.7)
GLUCOSE: 81 mg/dL (ref 65–99)
Potassium: 4.1 mmol/L (ref 3.5–5.3)
Sodium: 140 mmol/L (ref 135–146)
Total Protein: 7 g/dL (ref 6.1–8.1)

## 2019-07-12 LAB — CBC WITH DIFFERENTIAL/PLATELET
Absolute Monocytes: 437 cells/uL (ref 200–950)
BASOS ABS: 39 {cells}/uL (ref 0–200)
BASOS PCT: 0.5 %
EOS ABS: 101 {cells}/uL (ref 15–500)
Eosinophils Relative: 1.3 %
HEMATOCRIT: 39.9 % (ref 35.0–45.0)
HEMOGLOBIN: 13.3 g/dL (ref 11.7–15.5)
Lymphs Abs: 2473 cells/uL (ref 850–3900)
MCH: 30.6 pg (ref 27.0–33.0)
MCHC: 33.3 g/dL (ref 32.0–36.0)
MCV: 91.9 fL (ref 80.0–100.0)
MPV: 10.6 fL (ref 7.5–12.5)
Monocytes Relative: 5.6 %
Neutro Abs: 4750 cells/uL (ref 1500–7800)
Neutrophils Relative %: 60.9 %
Platelets: 242 10*3/uL (ref 140–400)
RBC: 4.34 10*6/uL (ref 3.80–5.10)
RDW: 12.4 % (ref 11.0–15.0)
Total Lymphocyte: 31.7 %
WBC: 7.8 10*3/uL (ref 3.8–10.8)

## 2019-07-12 LAB — HIV ANTIBODY (ROUTINE TESTING W REFLEX): HIV: NONREACTIVE

## 2019-07-12 LAB — LIPID PANEL
CHOLESTEROL: 214 mg/dL — AB (ref <200)
HDL: 45 mg/dL — AB (ref >=50)
LDL Cholesterol (Calc): 145 mg/dL (calc) — ABNORMAL HIGH
Non-HDL Cholesterol (Calc): 169 mg/dL (calc) — ABNORMAL HIGH (ref <130)
TRIGLYCERIDES: 122 mg/dL (ref <150)
Total CHOL/HDL Ratio: 4.8 (calc) (ref <5.0)

## 2019-07-12 LAB — HEMOGLOBIN A1C
EAG (MMOL/L): 5.5 (calc)
Hgb A1c MFr Bld: 5.1 % of total Hgb (ref <5.7)
MEAN PLASMA GLUCOSE: 100 (calc)

## 2019-07-12 LAB — RPR: RPR: NONREACTIVE

## 2019-07-12 LAB — HEPATITIS PANEL, ACUTE
HEP A IGM: NONREACTIVE
HEP B C IGM: NONREACTIVE
HEP B S AG: NONREACTIVE
Hepatitis C Ab: NONREACTIVE
SIGNAL TO CUT-OFF: 0.02 (ref <1.00)

## 2019-07-12 LAB — VITAMIN D 25 HYDROXY (VIT D DEFICIENCY, FRACTURES): Vit D, 25-Hydroxy: 23 ng/mL — ABNORMAL LOW (ref 30–100)

## 2019-07-12 LAB — VITAMIN B12: Vitamin B-12: >2000 pg/mL — ABNORMAL HIGH (ref 200–1100)

## 2019-07-16 ENCOUNTER — Telehealth: Payer: Self-pay | Admitting: Family Medicine

## 2019-07-16 NOTE — Telephone Encounter (Signed)
Copied from [place] [id number]. Topic: General - Other >> Jul 16, 2019  3:38 PM Keene Breath wrote: Reason for CRM: Patient called to ask that the nurse call her regarding the very bad indigestion she is having.  Patient believes it is a side effect from the medications she is taking.  Please advise and call patient back at [phone]

## 2019-07-19 NOTE — Telephone Encounter (Signed)
Patient states she has not taken any Hydroxyzine, she takes all of 3 new medications at night: Meloxicam, Cyclobenzaprine, and Celexa. After waking up the next morning she feels nervous, anxious, nauseous, and horrible indigestion and heart burn. She was reading these medications can have these side effects and wanted to know what to do?  Patient also wanted to let you know she is going to the Chiropractor now for her sciatica and it is improving. She has been 4 times total and plans to keep going.    Also, can she take Mag O7 at night to help her go to the bathroom? To help her go to the bathroom?

## 2019-08-15 ENCOUNTER — Encounter: Payer: Self-pay | Admitting: Family Medicine

## 2019-08-15 ENCOUNTER — Ambulatory Visit: Payer: 59 | Admitting: Family Medicine

## 2019-08-15 ENCOUNTER — Other Ambulatory Visit: Payer: Self-pay

## 2019-08-15 VITALS — BP 102/60 | HR 74 | Temp 97.1°F | Resp 16 | Ht 65.0 in | Wt 209.8 lb

## 2019-08-15 DIAGNOSIS — R194 Change in bowel habit: Secondary | ICD-10-CM | POA: Diagnosis not present

## 2019-08-15 DIAGNOSIS — R140 Abdominal distension (gaseous): Secondary | ICD-10-CM | POA: Diagnosis not present

## 2019-08-15 DIAGNOSIS — R1032 Left lower quadrant pain: Secondary | ICD-10-CM | POA: Diagnosis not present
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
## 2019-08-19 ENCOUNTER — Ambulatory Visit (INDEPENDENT_AMBULATORY_CARE_PROVIDER_SITE_OTHER): Payer: 59 | Admitting: Family Medicine

## 2019-08-19 ENCOUNTER — Other Ambulatory Visit: Payer: Self-pay

## 2019-08-19 ENCOUNTER — Encounter: Payer: Self-pay | Admitting: Family Medicine

## 2019-08-19 VITALS — BP 116/78 | HR 63 | Temp 97.8°F | Resp 16 | Ht 64.0 in | Wt 206.5 lb

## 2019-08-19 DIAGNOSIS — Z0000 Encounter for general adult medical examination without abnormal findings: Secondary | ICD-10-CM

## 2019-08-19 DIAGNOSIS — Z23 Encounter for immunization: Secondary | ICD-10-CM

## 2019-08-19 DIAGNOSIS — E538 Deficiency of other specified B group vitamins: Secondary | ICD-10-CM | POA: Diagnosis not present

## 2019-08-19 MED ORDER — CYANOCOBALAMIN 1000 MCG/ML IJ SOLN
1000.0000 ug | Freq: Once | INTRAMUSCULAR | Status: AC
  Administered 2019-08-19: 1000 ug via INTRAMUSCULAR

## 2019-08-19 NOTE — Progress Notes (Signed)
Name: Hannah Terry   MRN: [id number]    DOB: 1971-11-25   Date:08/19/2019       Progress Note  Subjective  Chief Complaint  Chief Complaint  Patient presents with  . Annual Exam    HPI  Patient presents for annual CPE.  Diet: eats fish at least twice a week, avoiding fried food, vegetables at least two servings daily, fruit twice weekly - advised to increase to 6 servings of fruit and vegetables per day  Exercise: not recently because of acute onset of abdominal pain and bloating, but prior to that she was walking 6 miles daily   USPSTF grade A and B recommendations    Office Visit from 08/19/2019 in [facility]  AUDIT-C Score  0     Depression: Phq 9 is  negative Depression screen [facility] 2/9 08/19/2019 08/15/2019 07/11/2019 08/17/2018 04/17/2018  Decreased Interest 0 0 0 0 0  Down, Depressed, Hopeless 0 0 0 0 0  PHQ - 2 Score 0 0 0 0 0  Altered sleeping 0 0 0 0 0  Tired, decreased energy 0 0 0 1 2  Change in appetite 0 0 0 0 0  Feeling bad or failure about yourself  0 0 0 0 0  Trouble concentrating 0 0 0 0 2  Moving slowly or fidgety/restless 0 0 0 0 0  Suicidal thoughts 0 0 0 0 0  PHQ-9 Score 0 0 0 1 4  Difficult doing work/chores Not difficult at all Not difficult at all - Not difficult at all Not difficult at all   Hypertension: BP Readings from Last 3 Encounters:  08/19/19 116/78  08/15/19 102/60  07/11/19 100/80   Obesity: Wt Readings from Last 3 Encounters:  08/19/19 206 lb 8 oz (93.7 kg)  08/15/19 209 lb 12.8 oz (95.2 kg)  07/11/19 201 lb 1.6 oz (91.2 kg)   BMI Readings from Last 3 Encounters:  08/19/19 35.45 kg/m  08/15/19 34.91 kg/m  07/11/19 34.25 kg/m     Hep C Screening: negative 06/2019 STD testing and prevention (HIV/chl/gon/syphilis): reviewed labs done 06/2019 Intimate partner violence:negative screen  Sexual History/Pain during Intercourse:no intercourse in over one year -separated since 11/2017, divorced 05/20/2019 Menstrual  History/LMP/Abnormal Bleeding: Mirena no cycles at all with IUD  Incontinence Symptoms: no problems   Breast cancer:  - Last Mammogram: order placed  - BRCA gene screening: N/A  Osteoporosis Screening: discussed high calcium and vitamin D diet   Cervical cancer screening: repeat in 2 more years or if she becomes sexually active   Skin cancer: discussed atypical lesions  Colorectal cancer: discussed USPTF, explained some Associations recommend earlier screening but she needs to contact insurance to find out about coverage  Lung cancer:  Low Dose CT Chest recommended if Age 10-80 years, 30 pack-year currently smoking OR have quit w/in 15years. Patient does not qualify.   ECG:06/2019  Advanced Care Planning: A voluntary discussion about advance care planning including the explanation and discussion of advance directives.  Discussed health care proxy and Living will, and the patient was able to identify a health care proxy as daughter   Patient does not know have a living will at present time.  Lipids: Lab Results  Component Value Date   CHOL 214 (H) 07/11/2019   CHOL 174 10/12/2017   CHOL 181 05/08/2017   Lab Results  Component Value Date   HDL 45 (L) 07/11/2019   HDL 42 (L) 10/12/2017   HDL 36 (L) 05/08/2017  Lab Results  Component Value Date   LDLCALC 145 (H) 07/11/2019   LDLCALC 117 (H) 10/12/2017   LDLCALC 126 (H) 05/08/2017   Lab Results  Component Value Date   TRIG 122 07/11/2019   TRIG 49 10/12/2017   TRIG 97 05/08/2017   Lab Results  Component Value Date   CHOLHDL 4.8 07/11/2019   CHOLHDL 4.1 10/12/2017   CHOLHDL 5.0 (H) 05/08/2017   No results found for: LDLDIRECT  Glucose: Glucose, Bld  Date Value Ref Range Status  07/11/2019 81 65 - 99 mg/dL Final    Comment:    .            Fasting reference interval .   10/12/2017 83 65 - 99 mg/dL Final    Comment:    .            Fasting reference interval .   05/08/2017 83 65 - 99 mg/dL Final     Patient Active Problem List   Diagnosis Date Noted  . Allergic rhinitis, seasonal 05/08/2017  . Vitamin D deficiency 02/07/2017  . Acanthosis nigricans 02/22/2016  . 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 Obesity (BMI 30.0-34.9) 01/12/2016  . MS (multiple sclerosis) ([place]) 01/12/2016  . B12 deficiency 12/23/2015  . IUD contraception 04/02/2015    No past surgical history on file.  Family History  Problem Relation Age of Onset  . Alcohol abuse Mother   . Alcohol abuse Father   . Drug abuse Father   . Heart disease Maternal Grandfather     Social History   Socioeconomic History  . Marital status: Divorced    Spouse name: Not on file  . Number of children: 4  . Years of education: Not on file  . Highest education level: High school graduate  Occupational History  . Occupation: Financial planner: [employer]  . Financial resource strain: Somewhat hard  . Food insecurity    Worry: Often true    Inability: Often true  . Transportation needs    Medical: No    Non-medical: No  Tobacco Use  . Smoking status: Never Smoker  . Smokeless tobacco: Former Systems developer    Types: Snuff, Chew  Substance and Sexual Activity  . Alcohol use: No    Alcohol/week: 0.0 standard drinks  . Drug use: No  . Sexual activity: Not Currently  Lifestyle  . Physical activity    Days per week: 3 days    Minutes per session: 60 min  . Stress: Not at all  Relationships  . Social connections    Talks on phone: More than three times a week    Gets together: Three times a week    Attends religious service: More than 4 times per year    Active member of club or organization: Yes    Attends meetings of clubs or organizations: More than 4 times per year    Relationship status: Divorced  . Intimate partner violence    Fear of current or ex partner: No    Emotionally abused: No    Physically abused: No    Forced sexual activity: No  Other Topics Concern  . Not on  file  Social History Narrative   She was married for  21 years, has 4 children.   Separated since 11/2017, struggling financially since, divorce final 05/20/2019     Current Outpatient Medications:  .  Azelastine-Fluticasone (DYMISTA) 137-50 MCG/ACT SUSP, Place 1 spray into the nose 2 (two)  times daily., Disp: 23 g, Rfl: 2 .  Blood Glucose Monitoring Suppl (CONTOUR NEXT MONITOR) w/Device KIT, 1 kit by Does not apply route daily., Disp: 1 kit, Rfl: 0 .  Cholecalciferol (VITAMIN D) 2000 units CAPS, Take 1 capsule (2,000 Units total) by mouth daily., Disp: 30 capsule, Rfl: 0 .  citalopram (CELEXA) 20 MG tablet, Take 1 tablet (20 mg total) by mouth daily., Disp: 90 tablet, Rfl: 1 .  cyclobenzaprine (FLEXERIL) 10 MG tablet, Take 1 tablet (10 mg total) by mouth at bedtime., Disp: 30 tablet, Rfl: 0 .  glucose blood (CONTOUR NEXT TEST) test strip, Use as instructed, Disp: 100 each, Rfl: 12 .  hydrOXYzine (ATARAX/VISTARIL) 10 MG tablet, Take 1 tablet (10 mg total) by mouth 3 (three) times daily as needed., Disp: 90 tablet, Rfl: 0 .  levocetirizine (XYZAL) 5 MG tablet, Take 1 tablet (5 mg total) by mouth every evening., Disp: 30 tablet, Rfl: 2 .  levonorgestrel (MIRENA, 52 MG,) 20 MCG/24HR IUD, 1 each by Intrauterine route., Disp: , Rfl:  .  meloxicam (MOBIC) 15 MG tablet, Take 1 tablet (15 mg total) by mouth daily., Disp: 30 tablet, Rfl: 0  Allergies  Allergen Reactions  . Ozempic [Semaglutide]     headache     ROS  Constitutional: Negative for fever or weight change.  Respiratory: Negative for cough and shortness of breath.   Cardiovascular: Negative for chest pain or palpitations.  Gastrointestinal: Negative for abdominal pain, positive for  bowel changes, she was seen last week, she took a lot of miralax and also used an enema and is feeling better today, discussed referral to GI but she would like to hold off, today is the last of her current insurance and will have a gap.   Musculoskeletal: Negative for gait problem or joint swelling.  Skin: Negative for rash.  Neurological: Negative for dizziness or headache.  No other specific complaints in a complete review of systems (except as listed in HPI above).  Objective  Vitals:   08/19/19 0756  BP: 116/78  Pulse: 63  Resp: 16  Temp: 97.8 F (36.6 C)  TempSrc: Oral  SpO2: 97%  Weight: 206 lb 8 oz (93.7 kg)  Height: _0  (1.626 m)    Body mass index is 35.45 kg/m.  Physical Exam  Constitutional: Patient appears well-developed and well-nourished. No distress.  HENT: Head: Normocephalic and atraumatic. Ears: B TMs ok, no erythema or effusion; Nose: Nose normal. Mouth/Throat: Oropharynx is clear and moist. No oropharyngeal exudate.  Eyes: Conjunctivae and EOM are normal. Pupils are equal, round, and reactive to light. No scleral icterus.  Neck: Normal range of motion. Neck supple. No JVD present. No thyromegaly present.  Cardiovascular: Normal rate, regular rhythm and normal heart sounds.  No murmur heard. No BLE edema. Pulmonary/Chest: Effort normal and breath sounds normal. No respiratory distress. Abdominal: Soft. Bowel sounds are normal, no distension. There is no tenderness. no masses Breast: no lumps or masses, no nipple discharge or rashes FEMALE GENITALIA:  Not done RECTAL: no rectal masses or hemorrhoids Musculoskeletal: Normal range of motion, no joint effusions. No gross deformities Neurological: he is alert and oriented to person, place, and time. No cranial nerve deficit. Coordination, balance, strength, speech and gait are normal.  Skin: Skin is warm and dry. No rash noted. No erythema.  Psychiatric: Patient has a normal mood and affect. behavior is normal. Judgment and thought content normal.  Recent Results (from the past 2160 hour(s))  Hemoglobin A1c  Status: None   Collection Time: 07/11/19  9:41 AM  Result Value Ref Range   Hgb A1c MFr Bld 5.1 <5.7 % of total Hgb    Comment:  For the purpose of screening for the presence of diabetes: . <5.7%       Consistent with the absence of diabetes 5.7-6.4%    Consistent with increased risk for diabetes             (prediabetes) > or =6.5%  Consistent with diabetes . This assay result is consistent with a decreased risk of diabetes. . Currently, no consensus exists regarding use of hemoglobin A1c for diagnosis of diabetes in children. . According to American Diabetes Association (ADA) guidelines, hemoglobin A1c <7.0% represents optimal control in non-pregnant diabetic patients. Different metrics may apply to specific patient populations.  Standards of Medical Care in Diabetes(ADA). .    Mean Plasma Glucose 100 (calc)   eAG (mmol/L) 5.5 (calc)  HIV antibody     Status: None   Collection Time: 07/11/19  9:41 AM  Result Value Ref Range   HIV 1&2 Ab, 4th Generation NON-REACTIVE NON-REACTI    Comment: HIV-1 antigen and HIV-1/HIV-2 antibodies were not detected. There is no laboratory evidence of HIV infection. Marland Kitchen PLEASE NOTE: This information has been disclosed to you from records whose confidentiality may be protected by state law.  If your state requires such protection, then the state law prohibits you from making any further disclosure of the information without the specific written consent of the person to whom it pertains, or as otherwise permitted by law. A general authorization for the release of medical or other information is NOT sufficient for this purpose. . For additional information please refer to http://education.[facility].com/faq/FAQ106 (This link is being provided for informational/ educational purposes only.) . Marland Kitchen The performance of this assay has not been clinically validated in patients less than 82 years old. .   Lipid panel     Status: Abnormal   Collection Time: 07/11/19  9:41 AM  Result Value Ref Range   Cholesterol 214 (H) <200 mg/dL   HDL 45 (L) > OR = 50 mg/dL    Triglycerides 122 <150 mg/dL   LDL Cholesterol (Calc) 145 (H) mg/dL (calc)    Comment: Reference range: <100 . Desirable range <100 mg/dL for primary prevention;   <70 mg/dL for patients with CHD or diabetic patients  with > or = 2 CHD risk factors. Marland Kitchen LDL-C is now calculated using the Martin-Hopkins  calculation, which is a validated novel method providing  better accuracy than the Friedewald equation in the  estimation of LDL-C.  Cresenciano Genre et al. Annamaria Helling. 5638;756(43): 2061-2068  (http://education.[facility].com/faq/FAQ164)    Total CHOL/HDL Ratio 4.8 <5.0 (calc)   Non-HDL Cholesterol (Calc) 169 (H) <130 mg/dL (calc)    Comment: For patients with diabetes plus 1 major ASCVD risk  factor, treating to a non-HDL-C goal of <100 mg/dL  (LDL-C of <70 mg/dL) is considered a therapeutic  option.   Vitamin B12     Status: Abnormal   Collection Time: 07/11/19  9:41 AM  Result Value Ref Range   Vitamin B-12 >2,000 (H) 200 - 1,100 pg/mL  VITAMIN D 25 Hydroxy (Vit-D Deficiency, Fractures)     Status: Abnormal   Collection Time: 07/11/19  9:41 AM  Result Value Ref Range   Vit D, 25-Hydroxy 23 (L) 30 - 100 ng/mL    Comment: Vitamin D Status         25-OH Vitamin D: .  Deficiency:                    <20 ng/mL Insufficiency:             20 - 29 ng/mL Optimal:                 > or = 30 ng/mL . For 25-OH Vitamin D testing on patients on  D2-supplementation and patients for whom quantitation  of D2 and D3 fractions is required, the QuestAssureD(TM) 25-OH VIT D, (D2,D3), LC/MS/MS is recommended: order  code [phone] (patients >51yr). See Note 1 . Note 1 . For additional information, please refer to  http://education.[facility].com/faq/FAQ199  (This link is being provided for informational/ educational purposes only.)   CBC with Differential/Platelet     Status: None   Collection Time: 07/11/19  9:41 AM  Result Value Ref Range   WBC 7.8 3.8 - 10.8 Thousand/uL   RBC 4.34 3.80 -  5.10 Million/uL   Hemoglobin 13.3 11.7 - 15.5 g/dL   HCT 39.9 35.0 - 45.0 %   MCV 91.9 80.0 - 100.0 fL   MCH 30.6 27.0 - 33.0 pg   MCHC 33.3 32.0 - 36.0 g/dL   RDW 12.4 11.0 - 15.0 %   Platelets 242 140 - 400 Thousand/uL   MPV 10.6 7.5 - 12.5 fL   Neutro Abs 4,750 1,500 - 7,800 cells/uL   Lymphs Abs 2,473 850 - 3,900 cells/uL   Absolute Monocytes 437 200 - 950 cells/uL   Eosinophils Absolute 101 15 - 500 cells/uL   Basophils Absolute 39 0 - 200 cells/uL   Neutrophils Relative % 60.9 %   Total Lymphocyte 31.7 %   Monocytes Relative 5.6 %   Eosinophils Relative 1.3 %   Basophils Relative 0.5 %  COMPLETE METABOLIC PANEL WITH GFR     Status: None   Collection Time: 07/11/19  9:41 AM  Result Value Ref Range   Glucose, Bld 81 65 - 99 mg/dL    Comment: .            Fasting reference interval .    BUN 11 7 - 25 mg/dL   Creat 0.77 0.50 - 1.10 mg/dL   GFR, Est Non African American 93 > OR = 60 mL/min/1.786m  GFR, Est African American 107 > OR = 60 mL/min/1.7317m BUN/Creatinine Ratio NOT APPLICABLE 6 - 22 (calc)   Sodium 140 135 - 146 mmol/L   Potassium 4.1 3.5 - 5.3 mmol/L   Chloride 107 98 - 110 mmol/L   CO2 22 20 - 32 mmol/L   Calcium 9.1 8.6 - 10.2 mg/dL   Total Protein 7.0 6.1 - 8.1 g/dL   Albumin 4.1 3.6 - 5.1 g/dL   Globulin 2.9 1.9 - 3.7 g/dL (calc)   AG Ratio 1.4 1.0 - 2.5 (calc)   Total Bilirubin 0.5 0.2 - 1.2 mg/dL   Alkaline phosphatase (APISO) 77 31 - 125 U/L   AST 15 10 - 35 U/L   ALT 15 6 - 29 U/L  RPR     Status: None   Collection Time: 07/11/19  9:41 AM  Result Value Ref Range   RPR Ser Ql NON-REACTIVE NON-REACTI  Hepatitis, Acute     Status: None   Collection Time: 07/11/19  9:41 AM  Result Value Ref Range   Hep A IgM NON-REACTIVE NON-REACTI   Hepatitis B Surface Ag NON-REACTIVE NON-REACTI   Hep B C IgM NON-REACTIVE NON-REACTI   Hepatitis C  Ab NON-REACTIVE NON-REACTI   SIGNAL TO CUT-OFF 0.02 <1.00    Comment: . HCV antibody was non-reactive. There is  no laboratory  evidence of HCV infection. . In most cases, no further action is required. However, if recent HCV exposure is suspected, a test for HCV RNA (test code [phone]) is suggested. . For additional information please refer to http://education.[facility].com/faq/FAQ22v1 (This link is being provided for informational/ educational purposes only.) . Marland Kitchen For additional information, please refer to  http://education.[facility].com/faq/FAQ202  (This link is being provided for informational/ educational purposes only.) .     Fall Risk: Fall Risk  08/19/2019 08/15/2019 07/11/2019 08/17/2018 04/17/2018  Falls in the past year? 0 0 0 No No  Number falls in past yr: 0 0 0 - -  Injury with Fall? 0 0 0 - -  Follow up Falls evaluation completed - - - -     Assessment & Plan  1. Well adult exam   2. Needs flu shot  - Flu Vaccine QUAD 36+ mos IM  3. B12 deficiency  - cyanocobalamin ((VITAMIN B-12)) injection 1,000 mcg  -USPSTF grade A and B recommendations reviewed with patient; age-appropriate recommendations, preventive care, screening tests, etc discussed and encouraged; healthy living encouraged; see AVS for patient education given to patient -Discussed importance of 150 minutes of physical activity weekly, eat two servings of fish weekly, eat one serving of tree nuts ( cashews, pistachios, pecans, almonds.Marland Kitchen) every other day, eat 6 servings of fruit/vegetables daily and drink plenty of water and avoid sweet beverages.

## 2019-08-19 NOTE — Patient Instructions (Signed)
Preventive Care 46-46 Years Old, Female Preventive care refers to visits with your health care provider and lifestyle choices that can promote health and wellness. This includes:  A yearly physical exam. This may also be called an annual well check.  Regular dental visits and eye exams.  Immunizations.  Screening for certain conditions.  Healthy lifestyle choices, such as eating a healthy diet, getting regular exercise, not using drugs or products that contain nicotine and tobacco, and limiting alcohol use. What can I expect for my preventive care visit? Physical exam Your health care provider will check your:  Height and weight. This may be used to calculate body mass index (BMI), which tells if you are at a healthy weight.  Heart rate and blood pressure.  Skin for abnormal spots. Counseling Your health care provider may ask you questions about your:  Alcohol, tobacco, and drug use.  Emotional well-being.  Home and relationship well-being.  Sexual activity.  Eating habits.  Work and work environment.  Method of birth control.  Menstrual cycle.  Pregnancy history. What immunizations do I need?  Influenza (flu) vaccine  This is recommended every year. Tetanus, diphtheria, and pertussis (Tdap) vaccine  You may need a Td booster every 10 years. Varicella (chickenpox) vaccine  You may need this if you have not been vaccinated. Zoster (shingles) vaccine  You may need this after age 60. Measles, mumps, and rubella (MMR) vaccine  You may need at least one dose of MMR if you were born in 1957 or later. You may also need a second dose. Pneumococcal conjugate (PCV13) vaccine  You may need this if you have certain conditions and were not previously vaccinated. Pneumococcal polysaccharide (PPSV23) vaccine  You may need one or two doses if you smoke cigarettes or if you have certain conditions. Meningococcal conjugate (MenACWY) vaccine  You may need this if you  have certain conditions. Hepatitis A vaccine  You may need this if you have certain conditions or if you travel or work in places where you may be exposed to hepatitis A. Hepatitis B vaccine  You may need this if you have certain conditions or if you travel or work in places where you may be exposed to hepatitis B. Haemophilus influenzae type b (Hib) vaccine  You may need this if you have certain conditions. Human papillomavirus (HPV) vaccine  If recommended by your health care provider, you may need three doses over 6 months. You may receive vaccines as individual doses or as more than one vaccine together in one shot (combination vaccines). Talk with your health care provider about the risks and benefits of combination vaccines. What tests do I need? Blood tests  Lipid and cholesterol levels. These may be checked every 5 years, or more frequently if you are over 46 years old.  Hepatitis C test.  Hepatitis B test. Screening  Lung cancer screening. You may have this screening every year starting at age 46 if you have a 30-pack-year history of smoking and currently smoke or have quit within the past 15 years.  Colorectal cancer screening. All adults should have this screening starting at age 46 and continuing until age 75. Your health care provider may recommend screening at age 46 if you are at increased risk. You will have tests every 1-10 years, depending on your results and the type of screening test.  Diabetes screening. This is done by checking your blood sugar (glucose) after you have not eaten for a while (fasting). You may have this   done every 1-3 years.  Mammogram. This may be done every 1-2 years. Talk with your health care provider about when you should start having regular mammograms. This may depend on whether you have a family history of breast cancer.  BRCA-related cancer screening. This may be done if you have a family history of breast, ovarian, tubal, or peritoneal  cancers.  Pelvic exam and Pap test. This may be done every 3 years starting at age 46. Starting at age 46, this may be done every 5 years if you have a Pap test in combination with an HPV test. Other tests  Sexually transmitted disease (STD) testing.  Bone density scan. This is done to screen for osteoporosis. You may have this scan if you are at high risk for osteoporosis. Follow these instructions at home: Eating and drinking  Eat a diet that includes fresh fruits and vegetables, whole grains, lean protein, and low-fat dairy.  Take vitamin and mineral supplements as recommended by your health care provider.  Do not drink alcohol if: ? Your health care provider tells you not to drink. ? You are pregnant, may be pregnant, or are planning to become pregnant.  If you drink alcohol: ? Limit how much you have to 0-1 drink a day. ? Be aware of how much alcohol is in your drink. In the [place] one drink equals one 12 oz bottle of beer (355 mL), one 5 oz glass of wine (148 mL), or one 1 oz glass of hard liquor (44 mL). Lifestyle  Take daily care of your teeth and gums.  Stay active. Exercise for at least 30 minutes on 5 or more days each week.  Do not use any products that contain nicotine or tobacco, such as cigarettes, e-cigarettes, and chewing tobacco. If you need help quitting, ask your health care provider.  If you are sexually active, practice safe sex. Use a condom or other form of birth control (contraception) in order to prevent pregnancy and STIs (sexually transmitted infections).  If told by your health care provider, take low-dose aspirin daily starting at age 46. What's next?  Visit your health care provider once a year for a well check visit.  Ask your health care provider how often you should have your eyes and teeth checked.  Stay up to date on all vaccines. This information is not intended to replace advice given to you by your health care provider. Make sure you  discuss any questions you have with your health care provider. Document Released: 01/01/2016 Document Revised: 08/16/2018 Document Reviewed: 08/16/2018 Elsevier Patient Education  2020 [employer]  

## 2020-08-19 ENCOUNTER — Other Ambulatory Visit: Payer: Self-pay

## 2020-08-19 ENCOUNTER — Ambulatory Visit (INDEPENDENT_AMBULATORY_CARE_PROVIDER_SITE_OTHER): Payer: 59

## 2020-08-19 DIAGNOSIS — E538 Deficiency of other specified B group vitamins: Secondary | ICD-10-CM

## 2020-08-19 MED ORDER — CYANOCOBALAMIN 1000 MCG/ML IJ SOLN
1000.0000 ug | Freq: Once | INTRAMUSCULAR | Status: AC
  Administered 2020-08-19: 1000 ug via INTRAMUSCULAR

## 2020-11-02 ENCOUNTER — Other Ambulatory Visit: Payer: Self-pay

## 2020-11-02 ENCOUNTER — Ambulatory Visit (INDEPENDENT_AMBULATORY_CARE_PROVIDER_SITE_OTHER): Payer: 59 | Admitting: Family Medicine

## 2020-11-02 ENCOUNTER — Encounter: Payer: Self-pay | Admitting: Family Medicine

## 2020-11-02 VITALS — BP 100/70 | HR 73 | Temp 98.2°F | Resp 16 | Ht 64.0 in | Wt 211.6 lb

## 2020-11-02 DIAGNOSIS — Z23 Encounter for immunization: Secondary | ICD-10-CM

## 2020-11-02 DIAGNOSIS — M79671 Pain in right foot: Secondary | ICD-10-CM

## 2020-11-02 DIAGNOSIS — M25571 Pain in right ankle and joints of right foot: Secondary | ICD-10-CM | POA: Diagnosis not present

## 2020-11-02 DIAGNOSIS — E669 Obesity, unspecified: Secondary | ICD-10-CM

## 2020-11-02 DIAGNOSIS — M25572 Pain in left ankle and joints of left foot: Secondary | ICD-10-CM

## 2020-11-02 DIAGNOSIS — M79672 Pain in left foot: Secondary | ICD-10-CM

## 2020-11-02 MED ORDER — MELOXICAM 15 MG PO TABS: 15.0000 mg | ORAL_TABLET | Freq: Every day | ORAL | 1 refills | Status: DC

## 2020-11-02 NOTE — Patient Instructions (Signed)
Plantar Fasciitis  Plantar fasciitis is a painful foot condition that affects the heel. It occurs when the band of tissue that connects the toes to the heel bone (plantar fascia) becomes irritated. This can happen as the result of exercising too much or doing other repetitive activities (overuse injury). The pain from plantar fasciitis can range from mild irritation to severe pain that makes it difficult to walk or move. The pain is usually worse in the morning after sleeping, or after sitting or lying down for a while. Pain may also be worse after long periods of walking or standing. What are the causes? This condition may be caused by:  Standing for long periods of time.  Wearing shoes that do not have good arch support.  Doing activities that put stress on joints (high-impact activities), including running, aerobics, and ballet.  Being overweight.  An abnormal way of walking (gait).  Tight muscles in the back of your lower leg (calf).  High arches in your feet.  Starting a new athletic activity. What are the signs or symptoms? The main symptom of this condition is heel pain. Pain may:  Be worse with first steps after a time of rest, especially in the morning after sleeping or after you have been sitting or lying down for a while.  Be worse after long periods of standing still.  Decrease after 30-45 minutes of activity, such as gentle walking. How is this diagnosed? This condition may be diagnosed based on your medical history and your symptoms. Your health care provider may ask questions about your activity level. Your health care provider will do a physical exam to check for:  A tender area on the bottom of your foot.  A high arch in your foot.  Pain when you move your foot.  Difficulty moving your foot. You may have imaging tests to confirm the diagnosis, such as:  X-rays.  Ultrasound.  MRI. How is this treated? Treatment for plantar fasciitis depends on how  severe your condition is. Treatment may include:  Rest, ice, applying pressure (compression), and raising the affected foot (elevation). This may be called RICE therapy. Your health care provider may recommend RICE therapy along with over-the-counter pain medicines to manage your pain.  Exercises to stretch your calves and your plantar fascia.  A splint that holds your foot in a stretched, upward position while you sleep (night splint).  Physical therapy to relieve symptoms and prevent problems in the future.  Injections of steroid medicine (cortisone) to relieve pain and inflammation.  Stimulating your plantar fascia with electrical impulses (extracorporeal shock wave therapy). This is usually the last treatment option before surgery.  Surgery, if other treatments have not worked after 12 months. Follow these instructions at home:  Managing pain, stiffness, and swelling  If directed, put ice on the painful area: ? Put ice in a plastic bag, or use a frozen bottle of water. ? Place a towel between your skin and the bag or bottle. ? Roll the bottom of your foot over the bag or bottle. ? Do this for 20 minutes, 2-3 times a day.  Wear athletic shoes that have air-sole or gel-sole cushions, or try wearing soft shoe inserts that are designed for plantar fasciitis.  Raise (elevate) your foot above the level of your heart while you are sitting or lying down. Activity  Avoid activities that cause pain. Ask your health care provider what activities are safe for you.  Do physical therapy exercises and stretches as told   by your health care provider.  Try activities and forms of exercise that are easier on your joints (low-impact). Examples include swimming, water aerobics, and biking. General instructions  Take over-the-counter and prescription medicines only as told by your health care provider.  Wear a night splint while sleeping, if told by your health care provider. Loosen the splint  if your toes tingle, become numb, or turn cold and blue.  Maintain a healthy weight, or work with your health care provider to lose weight as needed.  Keep all follow-up visits as told by your health care provider. This is important. Contact a health care provider if you:  Have symptoms that do not go away after caring for yourself at home.  Have pain that gets worse.  Have pain that affects your ability to move or do your daily activities. Summary  Plantar fasciitis is a painful foot condition that affects the heel. It occurs when the band of tissue that connects the toes to the heel bone (plantar fascia) becomes irritated.  The main symptom of this condition is heel pain that may be worse after exercising too much or standing still for a long time.  Treatment varies, but it usually starts with rest, ice, compression, and elevation (RICE therapy) and over-the-counter medicines to manage pain. This information is not intended to replace advice given to you by your health care provider. Make sure you discuss any questions you have with your health care provider. Document Revised: 11/17/2017 Document Reviewed: 10/02/2017 Elsevier Patient Education  2020 [employer]  

## 2020-11-02 NOTE — Progress Notes (Signed)
Patient ID: [name], female    DOB: July 03, 1971, 49 y.o.   MRN: [id number]  PCP: Hannah Sizer, MD  Chief Complaint  Patient presents with  . Foot Pain    She has pain on both left and right heels. Pain is worse in left than right heel. Onset x 2 months and it has been constant. Pain is sharp, throbbing and tender. Pain radiates down her foot and up her leg. She is also complaining of bilateral ankle pain after sitting or resting. When she stands up she has pain in her ankles and it hurts to walk.  . Weight Gain    She has some concerns about her weight.    Subjective:   Hannah Terry is a 49 y.o. 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 Allergic rhinitis, seasonal 05/08/2017  . Vitamin D deficiency 02/07/2017  . Acanthosis nigricans 02/22/2016  . 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 Obesity (BMI 30.0-34.9) 01/12/2016  . MS (multiple sclerosis) ([place]) 01/12/2016  . B12 deficiency 12/23/2015  . IUD contraception 04/02/2015      Current Outpatient Medications:  .   Azelastine-Fluticasone (DYMISTA) 137-50 MCG/ACT SUSP, Place 1 spray into the nose 2 (two) times daily., Disp: 23 g, Rfl: 2 .  Cholecalciferol (VITAMIN D) 2000 units CAPS, Take 1 capsule (2,000 Units total) by mouth daily., Disp: 30 capsule, Rfl: 0 .  citalopram (CELEXA) 20 MG tablet, Take 1 tablet (20 mg total) by mouth daily., Disp: 90 tablet, Rfl: 1 .  cyclobenzaprine (FLEXERIL) 10 MG tablet, Take 1 tablet (10 mg total) by mouth at bedtime., Disp: 30 tablet, Rfl: 0 .  hydrOXYzine (ATARAX/VISTARIL) 10 MG tablet, Take 1 tablet (10 mg total) by mouth 3 (three) times daily as needed., Disp: 90 tablet, Rfl: 0 .  levocetirizine (XYZAL) 5 MG tablet, Take 1 tablet (5 mg total) by mouth every evening., Disp: 30 tablet, Rfl: 2 .  levonorgestrel (MIRENA, 52 MG,) 20 MCG/24HR IUD, 1 each by Intrauterine route., Disp: , Rfl:  .  meloxicam (MOBIC) 15 MG tablet, Take 1 tablet (15 mg total) by mouth daily., Disp: 30 tablet, Rfl: 0   Allergies  Allergen Reactions  . Ozempic [Semaglutide]     headache     Social History   Tobacco Use  . Smoking status: Never Smoker  . Smokeless tobacco: Former Systems developer    Types: Snuff, Chew  Vaping Use  .  Vaping Use: Never used  Substance Use Topics  . Alcohol use: No    Alcohol/week: 0.0 standard drinks  . Drug use: No      Chart Review Today: I personally reviewed active problem list, medication list, allergies, family history, social history, health maintenance, notes from last encounter, lab results, imaging with the patient/caregiver today.   Review of Systems 10 Systems reviewed and are negative for acute change except as noted in the HPI.     Objective:   Vitals:   11/02/20 1134  BP: 100/70  Pulse: 73  Resp: 16  Temp: 98.2 F (36.8 C)  TempSrc: Oral  SpO2: 98%  Weight: 211 lb 9.6 oz (96 kg)  Height: 5\' 4"  (1.626 m)    Body mass index is 36.32 kg/m.  Physical Exam Vitals and nursing note reviewed.  Constitutional:      General: She is not  in acute distress.    Appearance: Normal appearance. She is well-developed. She is obese. She is not ill-appearing, toxic-appearing or diaphoretic.  HENT:     Head: Normocephalic and atraumatic.     Nose: Nose normal.  Eyes:     General:        Right eye: No discharge.        Left eye: No discharge.     Conjunctiva/sclera: Conjunctivae normal.  Neck:     Trachea: No tracheal deviation.  Cardiovascular:     Rate and Rhythm: Normal rate and regular rhythm.  Pulmonary:     Effort: Pulmonary effort is normal. No respiratory distress.     Breath sounds: No stridor.  Musculoskeletal:     Right ankle: Normal. No swelling or deformity. No tenderness. Normal range of motion. Normal pulse.     Right Achilles Tendon: Normal. No tenderness or defects. Thompson's test negative.     Left ankle: Normal. No swelling or deformity. No tenderness. Normal range of motion. Normal pulse.     Left Achilles Tendon: Normal. No tenderness or defects. Thompson's test negative.     Right foot: Normal capillary refill. No swelling, tenderness or bony tenderness. Normal pulse.     Left foot: Normal capillary refill. No swelling, tenderness or bony tenderness. Normal pulse.  Skin:    General: Skin is warm and dry.     Findings: No rash.  Neurological:     Mental Status: She is alert.     Motor: No abnormal muscle tone.     Coordination: Coordination normal.  Psychiatric:        Behavior: Behavior normal.       Results for orders placed or performed in visit on 08/17/18  Hemoglobin A1c  Result Value Ref Range   Hgb A1c MFr Bld 5.1 <5.7 % of total Hgb   Mean Plasma Glucose 100 (calc)   eAG (mmol/L) 5.5 (calc)  HIV antibody  Result Value Ref Range   HIV 1&2 Ab, 4th Generation NON-REACTIVE NON-REACTI  Lipid panel  Result Value Ref Range   Cholesterol 214 (H) <200 mg/dL   HDL 45 (L) > OR = 50 mg/dL   Triglycerides 122 <150 mg/dL   LDL Cholesterol (Calc) 145 (H) mg/dL (calc)   Total CHOL/HDL Ratio 4.8  <5.0 (calc)   Non-HDL Cholesterol (Calc) 169 (H) <130 mg/dL (calc)  Vitamin B12  Result Value Ref Range   Vitamin B-12 >2,000 (H) 200 - 1,100 pg/mL  VITAMIN D 25 Hydroxy (Vit-D Deficiency, Fractures)  Result Value Ref Range   Vit D, 25-Hydroxy 23 (L)  30 - 100 ng/mL  CBC with Differential/Platelet  Result Value Ref Range   WBC 7.8 3.8 - 10.8 Thousand/uL   RBC 4.34 3.80 - 5.10 Million/uL   Hemoglobin 13.3 11.7 - 15.5 g/dL   HCT 39.9 35 - 45 %   MCV 91.9 80.0 - 100.0 fL   MCH 30.6 27.0 - 33.0 pg   MCHC 33.3 32.0 - 36.0 g/dL   RDW 12.4 11.0 - 15.0 %   Platelets 242 140 - 400 Thousand/uL   MPV 10.6 7.5 - 12.5 fL   Neutro Abs 4,750 1,500 - 7,800 cells/uL   Lymphs Abs 2,473 850 - 3,900 cells/uL   Absolute Monocytes 437 200 - 950 cells/uL   Eosinophils Absolute 101 15.0 - 500.0 cells/uL   Basophils Absolute 39 0.0 - 200.0 cells/uL   Neutrophils Relative % 60.9 %   Total Lymphocyte 31.7 %   Monocytes Relative 5.6 %   Eosinophils Relative 1.3 %   Basophils Relative 0.5 %  COMPLETE METABOLIC PANEL WITH GFR  Result Value Ref Range   Glucose, Bld 81 65 - 99 mg/dL   BUN 11 7 - 25 mg/dL   Creat 0.77 0.50 - 1.10 mg/dL   GFR, Est Non African American 93 > OR = 60 mL/min/1.72m2   GFR, Est African American 107 > OR = 60 mL/min/1.39m2   BUN/Creatinine Ratio NOT APPLICABLE 6 - 22 (calc)   Sodium 140 135 - 146 mmol/L   Potassium 4.1 3.5 - 5.3 mmol/L   Chloride 107 98 - 110 mmol/L   CO2 22 20 - 32 mmol/L   Calcium 9.1 8.6 - 10.2 mg/dL   Total Protein 7.0 6.1 - 8.1 g/dL   Albumin 4.1 3.6 - 5.1 g/dL   Globulin 2.9 1.9 - 3.7 g/dL (calc)   AG Ratio 1.4 1.0 - 2.5 (calc)   Total Bilirubin 0.5 0.2 - 1.2 mg/dL   Alkaline phosphatase (APISO) 77 31 - 125 U/L   AST 15 10 - 35 U/L   ALT 15 6 - 29 U/L  RPR  Result Value Ref Range   RPR Ser Ql NON-REACTIVE NON-REACTI  Hepatitis, Acute  Result Value Ref Range   Hep A IgM NON-REACTIVE NON-REACTI   Hepatitis B Surface Ag NON-REACTIVE NON-REACTI    Hep B C IgM NON-REACTIVE NON-REACTI   Hepatitis C Ab NON-REACTIVE NON-REACTI   SIGNAL TO CUT-OFF 0.02 <1.00  Cytology - PAP  Result Value Ref Range   Adequacy      Satisfactory for evaluation  endocervical/transformation zone component PRESENT.   Diagnosis      NEGATIVE FOR INTRAEPITHELIAL LESIONS OR MALIGNANCY.   Bacterial vaginitis Negative for Bacterial Vaginitis Microorganisms    Chlamydia Negative    Neisseria Gonorrhea Negative    HPV NOT DETECTED    Trichomonas Negative    Material Submitted CervicoVaginal Pap [ThinPrep Imaged]        Assessment & Plan:   1. Heel pain, bilateral Pain not reproducible on exam, no visit edema, or erythema, no achilles defect b/l Pain worse in the morning and after any immobility - suspect plantar fasciitis Discussed conservative management, xray imaging etc and pt ultimately decided to do any imaging with referral to podiatry  Encouraged mobic, applying ice, stretching, working on increasing activity and some weight loss, possibly go to get evaluated for new shoes since pain followed new pain of tennis shoes 2.5 months ago   - meloxicam (MOBIC) 15 MG tablet; Take 1 tablet (15 mg total) by mouth daily.  Dispense: 30 tablet; Refill: 1 - Ambulatory referral to Podiatry  2. Acute bilateral ankle pain Pain radiates to foot and to ankle joint - normal ankle joints on exam  3. Need for immunization against influenza - Flu Vaccine QUAD 36+ mos IM  4. Obesity (BMI 30.0-34.9) Encouraged pt to work on increasing physical activity and reducing portions and calories and f/up with PCP for any medical weight management.  Bring in info about efforts in future appts to help get any possible meds approved    Delsa Grana, PA-C 11/02/20 11:43 AM

## 2020-11-09 ENCOUNTER — Other Ambulatory Visit: Payer: Self-pay | Admitting: Family Medicine

## 2020-11-09 DIAGNOSIS — R1032 Left lower quadrant pain: Secondary | ICD-10-CM

## 2020-12-23 ENCOUNTER — Encounter: Payer: 59 | Admitting: Family Medicine

## 2021-02-10 ENCOUNTER — Encounter: Payer: Self-pay | Admitting: Family Medicine

## 2021-02-10 ENCOUNTER — Ambulatory Visit
Admission: RE | Admit: 2021-02-10 | Discharge: 2021-02-10 | Disposition: A | Discharge status: Home / Self Care | Payer: 59 | Source: Ambulatory Visit | Attending: Family Medicine | Admitting: Family Medicine

## 2021-02-10 ENCOUNTER — Ambulatory Visit (INDEPENDENT_AMBULATORY_CARE_PROVIDER_SITE_OTHER): Payer: 59 | Admitting: Family Medicine

## 2021-02-10 ENCOUNTER — Other Ambulatory Visit: Payer: Self-pay

## 2021-02-10 VITALS — BP 132/86 | HR 80 | Temp 98.3°F | Resp 16 | Ht 64.0 in | Wt 214.1 lb

## 2021-02-10 DIAGNOSIS — Z131 Encounter for screening for diabetes mellitus: Secondary | ICD-10-CM

## 2021-02-10 DIAGNOSIS — M5431 Sciatica, right side: Secondary | ICD-10-CM | POA: Diagnosis not present

## 2021-02-10 DIAGNOSIS — Z1211 Encounter for screening for malignant neoplasm of colon: Secondary | ICD-10-CM

## 2021-02-10 DIAGNOSIS — E538 Deficiency of other specified B group vitamins: Secondary | ICD-10-CM

## 2021-02-10 DIAGNOSIS — F419 Anxiety disorder, unspecified: Secondary | ICD-10-CM | POA: Diagnosis not present

## 2021-02-10 DIAGNOSIS — G35 Multiple sclerosis: Secondary | ICD-10-CM

## 2021-02-10 DIAGNOSIS — E785 Hyperlipidemia, unspecified: Secondary | ICD-10-CM

## 2021-02-10 DIAGNOSIS — M533 Sacrococcygeal disorders, not elsewhere classified: Secondary | ICD-10-CM

## 2021-02-10 DIAGNOSIS — J302 Other seasonal allergic rhinitis: Secondary | ICD-10-CM

## 2021-02-10 DIAGNOSIS — Z113 Encounter for screening for infections with a predominantly sexual mode of transmission: Secondary | ICD-10-CM

## 2021-02-10 DIAGNOSIS — E559 Vitamin D deficiency, unspecified: Secondary | ICD-10-CM

## 2021-02-10 DIAGNOSIS — Z79899 Other long term (current) drug therapy: Secondary | ICD-10-CM

## 2021-02-10 MED ORDER — MELOXICAM 15 MG PO TABS: 15.0000 mg | ORAL_TABLET | Freq: Every day | ORAL | 0 refills | Status: DC

## 2021-02-10 MED ORDER — AZELASTINE-FLUTICASONE 137-50 MCG/ACT NA SUSP: 1.0000 | Freq: Two times a day (BID) | NASAL | 2 refills | Status: DC

## 2021-02-10 MED ORDER — CYCLOBENZAPRINE HCL 10 MG PO TABS: 10.0000 mg | ORAL_TABLET | Freq: Every evening | ORAL | 0 refills | Status: DC | PRN

## 2021-02-10 MED ORDER — HYDROXYZINE HCL 10 MG PO TABS: 10.0000 mg | ORAL_TABLET | Freq: Three times a day (TID) | ORAL | 0 refills | Status: DC | PRN

## 2021-02-10 NOTE — Progress Notes (Signed)
Name: Hannah Terry   MRN: [id number]    DOB: 01-21-1971   Date:02/10/2021       Progress Note  Subjective  Chief Complaint  Back pain  HPI  Recurrent sciatic right side: she takes flexeril prn, she states about 2 months ago she had developed significant coccyalgia but resolved once she stopped sitting on a kitchen chair, over the past week she developed pain on right lower back that radiates to right lateral thigh. She states coccyalgia improved to mild 2-3 but over the past few days pain has been intense again . She is out of meloxicam and flexeril. She has seen Dr. Joyce Copa in the past . Discussed referral to Ortho, x-ray and possible ortho or medication and Dr. Joyce Copa , she will like to start with x-ray for now and medication   AR: she needs a refill of medication, nasal spray really helps with symptoms. She has intermittent post-nasal drainage otherwise no symptoms   Vitamin D deficiency: reminded her to take otc vitamin D 2000 units   MS: she was on Tacfidera on her own, she states she had a lapse in insurance and could not afford it, advised to go back for follow up. She denies any symptoms at this time   B12 deficiency: not taking supplementation at this time  Dyslipidemia: not on medications, we will recheck level   Patient Active Problem List   Diagnosis Date Noted  . Allergic rhinitis, seasonal 05/08/2017  . Vitamin D deficiency 02/07/2017  . Encounter for routine checking of intrauterine contraceptive device (IUD) 03/31/2016  . Acanthosis nigricans 02/22/2016  . 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 Obesity (BMI 30.0-34.9) 01/12/2016  . MS (multiple sclerosis) ([place]) 01/12/2016  . B12 deficiency 12/23/2015  . IUD contraception 04/02/2015    History reviewed. No pertinent surgical history.  Family History  Problem Relation Age of Onset  . Alcohol abuse Mother   . Alcohol abuse Father   . Drug abuse Father   . Heart disease Maternal Grandfather     Social History    Tobacco Use  . Smoking status: Never Smoker  . Smokeless tobacco: Former Systems developer    Types: Snuff, Chew  Substance Use Topics  . Alcohol use: No    Alcohol/week: 0.0 standard drinks     Current Outpatient Medications:  .  Cholecalciferol (VITAMIN D) 2000 units CAPS, Take 1 capsule (2,000 Units total) by mouth daily., Disp: 30 capsule, Rfl: 0 .  levocetirizine (XYZAL) 5 MG tablet, Take 1 tablet (5 mg total) by mouth every evening., Disp: 30 tablet, Rfl: 2 .  levonorgestrel (MIRENA) 20 MCG/24HR IUD, 1 each by Intrauterine route., Disp: , Rfl:  .  Azelastine-Fluticasone (DYMISTA) 137-50 MCG/ACT SUSP, Place 1 spray into the nose 2 (two) times daily., Disp: 23 g, Rfl: 2 .  cyclobenzaprine (FLEXERIL) 10 MG tablet, Take 1 tablet (10 mg total) by mouth at bedtime as needed for muscle spasms., Disp: 30 tablet, Rfl: 0 .  hydrOXYzine (ATARAX/VISTARIL) 10 MG tablet, Take 1 tablet (10 mg total) by mouth 3 (three) times daily as needed., Disp: 90 tablet, Rfl: 0 .  meloxicam (MOBIC) 15 MG tablet, Take 1 tablet (15 mg total) by mouth daily., Disp: 90 tablet, Rfl: 0  Allergies  Allergen Reactions  . Ozempic [Semaglutide]     headache    I personally reviewed active problem list, medication list, allergies, family history, social history, health maintenance with the patient/caregiver today.   ROS  Constitutional: Negative for fever or significant  weight  change.  Respiratory: Negative for cough and shortness of breath.   Cardiovascular: Negative for chest pain or palpitations.  Gastrointestinal: Negative for abdominal pain, no bowel changes.  Musculoskeletal: positive  for gait problem but no  joint swelling.  Skin: Negative for rash.  Neurological: Negative for dizziness or headache.  No other specific complaints in a complete review of systems (except as listed in HPI above).  Objective  Vitals:   02/10/21 1543  BP: 132/86  Pulse: 80  Resp: 16  Temp: 98.3 F (36.8 C)  TempSrc: Oral   SpO2: 99%  Weight: 214 lb 1.6 oz (97.1 kg)  Height: 5\' 4"  (1.626 m)    Body mass index is 36.75 kg/m.  Physical Exam  Constitutional: Patient appears well-developed and well-nourished. Obese  No distress.  HEENT: head atraumatic, normocephalic, pupils equal and reactive to light,  neck supple Cardiovascular: Normal rate, regular rhythm and normal heart sounds.  No murmur heard. No BLE edema. Pulmonary/Chest: Effort normal and breath sounds normal. No respiratory distress. Abdominal: Soft.  There is no tenderness. Muscular Skeletal: pain during palpation of right lower back , negative straight leg raise, coccygeal pain also present  Psychiatric: Patient has a normal mood and affect. behavior is normal. Judgment and thought content normal.  PHQ2/9: Depression screen [facility] 2/9 02/10/2021 11/02/2020 08/19/2019 08/15/2019 07/11/2019  Decreased Interest 1 0 0 0 0  Down, Depressed, Hopeless 0 0 0 0 0  PHQ - 2 Score 1 0 0 0 0  Altered sleeping 1 - 0 0 0  Tired, decreased energy 2 - 0 0 0  Change in appetite 0 - 0 0 0  Feeling bad or failure about yourself  0 - 0 0 0  Trouble concentrating 0 - 0 0 0  Moving slowly or fidgety/restless 0 - 0 0 0  Suicidal thoughts 0 - 0 0 0  PHQ-9 Score 4 - 0 0 0  Difficult doing work/chores Not difficult at all - Not difficult at all Not difficult at all -    phq 9 is positive   Fall Risk: Fall Risk  02/10/2021 11/02/2020 08/19/2019 08/15/2019 07/11/2019  Falls in the past year? 0 0 0 0 0  Number falls in past yr: 0 0 0 0 0  Injury with Fall? 0 0 0 0 0  Follow up - - Falls evaluation completed - -     Functional Status Survey: Is the patient deaf or have difficulty hearing?: No Does the patient have difficulty seeing, even when wearing glasses/contacts?: No Does the patient have difficulty concentrating, remembering, or making decisions?: No Does the patient have difficulty walking or climbing stairs?: No Does the patient have difficulty dressing or  bathing?: No Does the patient have difficulty doing errands alone such as visiting a doctor's office or shopping?: No   Assessment & Plan  1. Anxiety  - hydrOXYzine (ATARAX/VISTARIL) 10 MG tablet; Take 1 tablet (10 mg total) by mouth 3 (three) times daily as needed.  Dispense: 90 tablet; Refill: 0  2. Sciatica of right side  - cyclobenzaprine (FLEXERIL) 10 MG tablet; Take 1 tablet (10 mg total) by mouth at bedtime as needed for muscle spasms.  Dispense: 30 tablet; Refill: 0 - meloxicam (MOBIC) 15 MG tablet; Take 1 tablet (15 mg total) by mouth daily.  Dispense: 90 tablet; Refill: 0  3. Other seasonal allergic rhinitis  - Azelastine-Fluticasone (DYMISTA) 137-50 MCG/ACT SUSP; Place 1 spray into the nose 2 (two) times daily.  Dispense: 23 g; Refill:  2  4. MS (multiple sclerosis) ([place])  Needs to follow up with neurologist   5. Colon cancer screening  - Ambulatory referral to Gastroenterology  6. B12 deficiency   7. Vitamin D deficiency  She needs to resume supplementation   8. Coccyalgia  - DG Sacrum/Coccyx; Future  9. Dyslipidemia  - Lipid panel  10. Routine screening for STI (sexually transmitted infection)  - HIV Antibody (routine testing w rflx) - RPR  11. Long-term use of high-risk medication  - COMPLETE METABOLIC PANEL WITH GFR - CBC with Differential/Platelet  12. Diabetes mellitus screening  - Hemoglobin A1c

## 2021-02-11 ENCOUNTER — Other Ambulatory Visit: Payer: Self-pay | Admitting: Family Medicine

## 2021-02-11 DIAGNOSIS — E559 Vitamin D deficiency, unspecified: Secondary | ICD-10-CM

## 2021-02-11 LAB — CBC WITH DIFFERENTIAL/PLATELET
Absolute Monocytes: 542 cells/uL (ref 200–950)
Basophils Absolute: 60 cells/uL (ref 0–200)
Basophils Relative: 0.7 %
Eosinophils Absolute: 103 cells/uL (ref 15–500)
Eosinophils Relative: 1.2 %
HCT: 41 % (ref 35.0–45.0)
Hemoglobin: 13.9 g/dL (ref 11.7–15.5)
Lymphs Abs: 3113 cells/uL (ref 850–3900)
MCH: 30.6 pg (ref 27.0–33.0)
MCHC: 33.9 g/dL (ref 32.0–36.0)
MCV: 90.3 fL (ref 80.0–100.0)
MPV: 10.1 fL (ref 7.5–12.5)
Monocytes Relative: 6.3 %
Neutro Abs: 4782 cells/uL (ref 1500–7800)
Neutrophils Relative %: 55.6 %
Platelets: 290 10*3/uL (ref 140–400)
RBC: 4.54 10*6/uL (ref 3.80–5.10)
RDW: 11.8 % (ref 11.0–15.0)
Total Lymphocyte: 36.2 %
WBC: 8.6 10*3/uL (ref 3.8–10.8)

## 2021-02-11 LAB — RPR: RPR Ser Ql: NONREACTIVE

## 2021-02-11 LAB — COMPLETE METABOLIC PANEL WITHOUT GFR
AG Ratio: 1.3 (calc) (ref 1.0–2.5)
ALT: 17 U/L (ref 6–29)
AST: 16 U/L (ref 10–35)
Albumin: 4.3 g/dL (ref 3.6–5.1)
Alkaline phosphatase (APISO): 83 U/L (ref 31–125)
BUN: 12 mg/dL (ref 7–25)
CO2: 30 mmol/L (ref 20–32)
Calcium: 9.8 mg/dL (ref 8.6–10.2)
Chloride: 104 mmol/L (ref 98–110)
Creat: 0.96 mg/dL (ref 0.50–1.10)
GFR, Est African American: 82 mL/min/1.73m2
GFR, Est Non African American: 70 mL/min/1.73m2
Globulin: 3.2 g/dL (ref 1.9–3.7)
Glucose, Bld: 95 mg/dL (ref 65–99)
Potassium: 4.6 mmol/L (ref 3.5–5.3)
Sodium: 142 mmol/L (ref 135–146)
Total Bilirubin: 0.4 mg/dL (ref 0.2–1.2)
Total Protein: 7.5 g/dL (ref 6.1–8.1)

## 2021-02-11 LAB — LIPID PANEL
Cholesterol: 231 mg/dL — ABNORMAL HIGH (ref <200)
HDL: 47 mg/dL — ABNORMAL LOW (ref >=50)
LDL Cholesterol (Calc): 157 mg/dL (calc) — ABNORMAL HIGH
Non-HDL Cholesterol (Calc): 184 mg/dL (calc) — ABNORMAL HIGH (ref <130)
Total CHOL/HDL Ratio: 4.9 (calc) (ref <5.0)
Triglycerides: 145 mg/dL (ref <150)

## 2021-02-11 LAB — HEMOGLOBIN A1C
Hgb A1c MFr Bld: 5.3 % of total Hgb (ref <5.7)
Mean Plasma Glucose: 105 mg/dL
eAG (mmol/L): 5.8 mmol/L

## 2021-02-11 LAB — VITAMIN D 25 HYDROXY (VIT D DEFICIENCY, FRACTURES): Vit D, 25-Hydroxy: 14 ng/mL — ABNORMAL LOW (ref 30–100)

## 2021-02-11 LAB — VITAMIN B12: Vitamin B-12: 334 pg/mL (ref 200–1100)

## 2021-02-11 LAB — HIV ANTIBODY (ROUTINE TESTING W REFLEX): HIV 1&2 Ab, 4th Generation: NONREACTIVE

## 2021-02-11 MED ORDER — VITAMIN D (ERGOCALCIFEROL) 1.25 MG (50000 UNIT) PO CAPS: 50000.0000 [IU] | ORAL_CAPSULE | ORAL | 1 refills | Status: DC

## 2021-02-12 ENCOUNTER — Telehealth: Payer: Self-pay

## 2021-02-12 ENCOUNTER — Other Ambulatory Visit: Payer: Self-pay

## 2021-02-12 DIAGNOSIS — Z1211 Encounter for screening for malignant neoplasm of colon: Secondary | ICD-10-CM

## 2021-02-12 NOTE — Telephone Encounter (Signed)
Copied from [place] [id number]. Topic: General - Other >> Feb 12, 2021 12:17 PM Celene Kras wrote: Reason for CRM: Pt called and is requesting to have PCP nurse give her a call back to go over lab results and x ray results. Please advise.

## 2021-02-12 NOTE — Telephone Encounter (Signed)
Spoke with patient and relayed results per Dr. Ancil Boozer. Patient was pleasant and expressed interest in colonoscopy. Refer/order placed. Patient expressed no further questions/concerns at this time.

## 2021-02-12 NOTE — Telephone Encounter (Signed)
Labs have not yet been reviewed by Dr. Ancil Boozer. As soon as she has reviewed them, we will contact her and let her know.

## 2021-02-22 ENCOUNTER — Encounter: Payer: Self-pay | Admitting: *Deleted

## 2021-03-15 NOTE — Progress Notes (Signed)
Name: Hannah Terry   MRN: [id number]    DOB: 09/10/1971   Date:03/16/2021       Progress Note  Subjective  Chief Complaint  Annual Exam  HPI  Patient presents for annual CPE.  Diet: she has changed her diet, eating more tree nuts, more fish, avoiding fried food  Exercise: she is getting 10 k steps per day   [place] Office Visit from 02/10/2021 in [facility]  AUDIT-C Score 0     Depression: Phq 9 is  negative Depression screen [facility] 2/9 03/16/2021 02/10/2021 11/02/2020 08/19/2019 08/15/2019  Decreased Interest 0 1 0 0 0  Down, Depressed, Hopeless 0 0 0 0 0  PHQ - 2 Score 0 1 0 0 0  Altered sleeping 0 1 - 0 0  Tired, decreased energy 0 2 - 0 0  Change in appetite 0 0 - 0 0  Feeling bad or failure about yourself  0 0 - 0 0  Trouble concentrating 0 0 - 0 0  Moving slowly or fidgety/restless 0 0 - 0 0  Suicidal thoughts 0 0 - 0 0  PHQ-9 Score 0 4 - 0 0  Difficult doing work/chores Not difficult at all Not difficult at all - Not difficult at all Not difficult at all  Some recent data might be hidden   Hypertension: BP Readings from Last 3 Encounters:  03/16/21 124/76  02/10/21 132/86  11/02/20 100/70   Obesity: Wt Readings from Last 3 Encounters:  03/16/21 216 lb 8 oz (98.2 kg)  02/10/21 214 lb 1.6 oz (97.1 kg)  11/02/20 211 lb 9.6 oz (96 kg)   BMI Readings from Last 3 Encounters:  03/16/21 37.16 kg/m  02/10/21 36.75 kg/m  11/02/20 36.32 kg/m     Vaccines:   Pneumonia: educated and discussed with patient. Flu: up to date   Hep C Screening: 07/11/19 STD testing and prevention (HIV/chl/gon/syphilis): 02/10/21 Intimate partner violence: negative Sexual History : she is in a new relationship since August 2021 Menstrual History/LMP/Abnormal Bleeding: she has IUD and spots every few months  Incontinence Symptoms: having urgency, urinary frequency  Breast cancer:  - Last Mammogram: last one 2019, we will schedule one  - BRCA gene  screening: N/A only one aunt with ovarian cancer   Osteoporosis: Discussed high calcium and vitamin D supplementation, weight bearing exercises  Cervical cancer screening: 08/17/18  Skin cancer: Discussed monitoring for atypical lesions  Colorectal cancer: she will have colonoscopy done soon  Lung cancer: Low Dose CT Chest recommended if Age 57-80 years, 20 pack-year currently smoking OR have quit w/in 15years. Patient does not qualify.   ECG: 07/11/19  Advanced Care Planning: A voluntary discussion about advance care planning including the explanation and discussion of advance directives.  Discussed health care proxy and Living will, and the patient was able to identify a health care proxy as daughter Lonn [place]   Lipids: Lab Results  Component Value Date   CHOL 231 (H) 02/10/2021   CHOL 214 (H) 07/11/2019   CHOL 174 10/12/2017   Lab Results  Component Value Date   HDL 47 (L) 02/10/2021   HDL 45 (L) 07/11/2019   HDL 42 (L) 10/12/2017   Lab Results  Component Value Date   LDLCALC 157 (H) 02/10/2021   LDLCALC 145 (H) 07/11/2019   LDLCALC 117 (H) 10/12/2017   Lab Results  Component Value Date   TRIG 145 02/10/2021   TRIG 122 07/11/2019   TRIG 49 10/12/2017  Lab Results  Component Value Date   CHOLHDL 4.9 02/10/2021   CHOLHDL 4.8 07/11/2019   CHOLHDL 4.1 10/12/2017   No results found for: LDLDIRECT  Glucose: Glucose, Bld  Date Value Ref Range Status  02/10/2021 95 65 - 99 mg/dL Final    Comment:    .            Fasting reference interval .   07/11/2019 81 65 - 99 mg/dL Final    Comment:    .            Fasting reference interval .   10/12/2017 83 65 - 99 mg/dL Final    Comment:    .            Fasting reference interval .     Patient Active Problem List   Diagnosis Date Noted  . Allergic rhinitis, seasonal 05/08/2017  . Vitamin D deficiency 02/07/2017  . Encounter for routine checking of intrauterine contraceptive device (IUD) 03/31/2016  .  Acanthosis nigricans 02/22/2016  . 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 Obesity (BMI 30.0-34.9) 01/12/2016  . MS (multiple sclerosis) ([place]) 01/12/2016  . B12 deficiency 12/23/2015  . IUD contraception 04/02/2015    No past surgical history on file.  Family History  Problem Relation Age of Onset  . Alcohol abuse Mother   . Alcohol abuse Father   . Drug abuse Father   . Heart disease Maternal Grandfather     Social History   Socioeconomic History  . Marital status: Divorced    Spouse name: Not on file  . Number of children: 4  . Years of education: Not on file  . Highest education level: High school graduate  Occupational History  . Occupation: Financial planner: [employer]  Tobacco Use  . Smoking status: Never Smoker  . Smokeless tobacco: Former Systems developer    Types: Snuff, Chew  Vaping Use  . Vaping Use: Never used  Substance and Sexual Activity  . Alcohol use: No    Alcohol/week: 0.0 standard drinks  . Drug use: No  . Sexual activity: Not Currently  Other Topics Concern  . Not on file  Social History Narrative   She was married for  21 years, has 4 children.   Separated since 11/2017, struggling financially since, divorce final 05/20/2019   Social Determinants of Health   Financial Resource Strain: Medium Risk  . Difficulty of Paying Living Expenses: Somewhat hard  Food Insecurity: Food Insecurity Present  . Worried About Charity fundraiser in the Last Year: Sometimes true  . Ran Out of Food in the Last Year: Never true  Transportation Needs: No Transportation Needs  . Lack of Transportation (Medical): No  . Lack of Transportation (Non-Medical): No  Physical Activity: Sufficiently Active  . Days of Exercise per Week: 7 days  . Minutes of Exercise per Session: 50 min  Stress: No Stress Concern Present  . Feeling of Stress : Not at all  Social Connections: Moderately Integrated  . Frequency of Communication with Friends and Family: More than  three times a week  . Frequency of Social Gatherings with Friends and Family: Twice a week  . Attends Religious Services: More than 4 times per year  . Active Member of Clubs or Organizations: Yes  . Attends Archivist Meetings: 1 to 4 times per year  . Marital Status: Divorced  Human resources officer Violence: Not At Risk  . Fear of Current or Ex-Partner: No  .  Emotionally Abused: No  . Physically Abused: No  . Sexually Abused: No   She has a job now, last year was a problem    Current Outpatient Medications:  .  Azelastine-Fluticasone (DYMISTA) 137-50 MCG/ACT SUSP, Place 1 spray into the nose 2 (two) times daily., Disp: 23 g, Rfl: 2 .  Cholecalciferol (VITAMIN D) 2000 units CAPS, Take 1 capsule (2,000 Units total) by mouth daily., Disp: 30 capsule, Rfl: 0 .  cyclobenzaprine (FLEXERIL) 10 MG tablet, Take 1 tablet (10 mg total) by mouth at bedtime as needed for muscle spasms., Disp: 30 tablet, Rfl: 0 .  hydrOXYzine (ATARAX/VISTARIL) 10 MG tablet, Take 1 tablet (10 mg total) by mouth 3 (three) times daily as needed., Disp: 90 tablet, Rfl: 0 .  levocetirizine (XYZAL) 5 MG tablet, Take 1 tablet (5 mg total) by mouth every evening., Disp: 30 tablet, Rfl: 2 .  levonorgestrel (MIRENA) 20 MCG/24HR IUD, 1 each by Intrauterine route., Disp: , Rfl:  .  meloxicam (MOBIC) 15 MG tablet, Take 1 tablet (15 mg total) by mouth daily., Disp: 90 tablet, Rfl: 0 .  Vitamin D, Ergocalciferol, (DRISDOL) 1.25 MG (50000 UNIT) CAPS capsule, Take 1 capsule (50,000 Units total) by mouth every 7 (seven) days., Disp: 12 capsule, Rfl: 1  Allergies  Allergen Reactions  . Ozempic [Semaglutide]     headache    ROS  Constitutional: Negative for fever or weight change.  Respiratory: Negative for cough and shortness of breath.   Cardiovascular: Negative for chest pain or palpitations.  Gastrointestinal: Negative for abdominal pain, no bowel changes.  Musculoskeletal: Negative for gait problem or joint swelling.   Skin: Negative for rash.  Neurological: Negative for dizziness or headache.  No other specific complaints in a complete review of systems (except as listed in HPI above).  Objective  Vitals:   03/16/21 1136  BP: 124/76  Pulse: 81  Resp: 16  Temp: 98.1 F (36.7 C)  SpO2: 98%  Weight: 216 lb 8 oz (98.2 kg)  Height: 5' 4"  (1.626 m)    Body mass index is 37.16 kg/m.  Physical Exam  Constitutional: Patient appears well-developed and well-nourished. No distress.  HENT: Head: Normocephalic and atraumatic. Ears: B TMs ok, no erythema or effusion; Nose: Not done Mouth/Throat: not done  Eyes: Conjunctivae and EOM are normal. Pupils are equal, round, and reactive to light. No scleral icterus.  Neck: Normal range of motion. Neck supple. No JVD present. No thyromegaly present.  Cardiovascular: Normal rate, regular rhythm and normal heart sounds.  No murmur heard. No BLE edema. Pulmonary/Chest: Effort normal and breath sounds normal. No respiratory distress. Abdominal: Soft. Bowel sounds are normal, no distension. There is no tenderness. no masses Breast: no lumps or masses, no nipple discharge or rashes FEMALE GENITALIA:  External genitalia normal External urethra normal Vaginal vault normal without discharge or lesions Cervix normal without discharge or lesions, IUD string not seen, she states it was short  Bimanual exam normal without masses RECTAL: not done  Musculoskeletal: Normal range of motion, no joint effusions. No gross deformities Neurological: he is alert and oriented to person, place, and time. No cranial nerve deficit. Coordination, balance, strength, speech and gait are normal.  Skin: Skin is warm and dry. No rash noted. No erythema.  Psychiatric: Patient has a normal mood and affect. behavior is normal. Judgment and thought content normal.  Recent Results (from the past 2160 hour(s))  Lipid panel     Status: Abnormal   Collection Time: 02/10/21  4:24 PM  Result  Value Ref Range   Cholesterol 231 (H) <200 mg/dL   HDL 47 (L) > OR = 50 mg/dL   Triglycerides 145 <150 mg/dL   LDL Cholesterol (Calc) 157 (H) mg/dL (calc)    Comment: Reference range: <100 . Desirable range <100 mg/dL for primary prevention;   <70 mg/dL for patients with CHD or diabetic patients  with > or = 2 CHD risk factors. Marland Kitchen LDL-C is now calculated using the Martin-Hopkins  calculation, which is a validated novel method providing  better accuracy than the Friedewald equation in the  estimation of LDL-C.  Cresenciano Genre et al. Annamaria Helling. 1779;390(30): 2061-2068  (http://education.[facility].com/faq/FAQ164)    Total CHOL/HDL Ratio 4.9 <5.0 (calc)   Non-HDL Cholesterol (Calc) 184 (H) <130 mg/dL (calc)    Comment: For patients with diabetes plus 1 major ASCVD risk  factor, treating to a non-HDL-C goal of <100 mg/dL  (LDL-C of <70 mg/dL) is considered a therapeutic  option.   COMPLETE METABOLIC PANEL WITH GFR     Status: None   Collection Time: 02/10/21  4:24 PM  Result Value Ref Range   Glucose, Bld 95 65 - 99 mg/dL    Comment: .            Fasting reference interval .    BUN 12 7 - 25 mg/dL   Creat 0.96 0.50 - 1.10 mg/dL   GFR, Est Non African American 70 > OR = 60 mL/min/1.56m   GFR, Est African American 82 > OR = 60 mL/min/1.734m  BUN/Creatinine Ratio NOT APPLICABLE 6 - 22 (calc)   Sodium 142 135 - 146 mmol/L   Potassium 4.6 3.5 - 5.3 mmol/L   Chloride 104 98 - 110 mmol/L   CO2 30 20 - 32 mmol/L   Calcium 9.8 8.6 - 10.2 mg/dL   Total Protein 7.5 6.1 - 8.1 g/dL   Albumin 4.3 3.6 - 5.1 g/dL   Globulin 3.2 1.9 - 3.7 g/dL (calc)   AG Ratio 1.3 1.0 - 2.5 (calc)   Total Bilirubin 0.4 0.2 - 1.2 mg/dL   Alkaline phosphatase (APISO) 83 31 - 125 U/L   AST 16 10 - 35 U/L   ALT 17 6 - 29 U/L  VITAMIN D 25 Hydroxy (Vit-D Deficiency, Fractures)     Status: Abnormal   Collection Time: 02/10/21  4:24 PM  Result Value Ref Range   Vit D, 25-Hydroxy 14 (L) 30 - 100 ng/mL     Comment: Vitamin D Status         25-OH Vitamin D: . Deficiency:                    <20 ng/mL Insufficiency:             20 - 29 ng/mL Optimal:                 > or = 30 ng/mL . For 25-OH Vitamin D testing on patients on  D2-supplementation and patients for whom quantitation  of D2 and D3 fractions is required, the [facility]ureD(TM) 25-OH VIT D, (D2,D3), LC/MS/MS is recommended: order  code [phone] >2y71yr See Note 1 . Note 1 . For additional information, please refer to  http://education.[facility].com/faq/FAQ199  (This link is being provided for informational/ educational purposes only.)   Vitamin B12     Status: None   Collection Time: 02/10/21  4:24 PM  Result Value Ref Range   Vitamin B-12 334 200 - 1,100  pg/mL    Comment: . Please Note: Although the reference range for vitamin B12 is 6312763510 pg/mL, it has been reported that between 5 and 10% of patients with values between 200 and 400 pg/mL may experience neuropsychiatric and hematologic abnormalities due to occult B12 deficiency; less than 1% of patients with values above 400 pg/mL will have symptoms. .   Hemoglobin A1c     Status: None   Collection Time: 02/10/21  4:24 PM  Result Value Ref Range   Hgb A1c MFr Bld 5.3 <5.7 % of total Hgb    Comment: For the purpose of screening for the presence of diabetes: . <5.7%       Consistent with the absence of diabetes 5.7-6.4%    Consistent with increased risk for diabetes             (prediabetes) > or =6.5%  Consistent with diabetes . This assay result is consistent with a decreased risk of diabetes. . Currently, no consensus exists regarding use of hemoglobin A1c for diagnosis of diabetes in children. . According to American Diabetes Association (ADA) guidelines, hemoglobin A1c <7.0% represents optimal control in non-pregnant diabetic patients. Different metrics may apply to specific patient populations.  Standards of Medical Care in  Diabetes(ADA). .    Mean Plasma Glucose 105 mg/dL   eAG (mmol/L) 5.8 mmol/L  CBC with Differential/Platelet     Status: None   Collection Time: 02/10/21  4:24 PM  Result Value Ref Range   WBC 8.6 3.8 - 10.8 Thousand/uL   RBC 4.54 3.80 - 5.10 Million/uL   Hemoglobin 13.9 11.7 - 15.5 g/dL   HCT 41.0 35.0 - 45.0 %   MCV 90.3 80.0 - 100.0 fL   MCH 30.6 27.0 - 33.0 pg   MCHC 33.9 32.0 - 36.0 g/dL   RDW 11.8 11.0 - 15.0 %   Platelets 290 140 - 400 Thousand/uL   MPV 10.1 7.5 - 12.5 fL   Neutro Abs 4,782 1,500 - 7,800 cells/uL   Lymphs Abs 3,113 850 - 3,900 cells/uL   Absolute Monocytes 542 200 - 950 cells/uL   Eosinophils Absolute 103 15 - 500 cells/uL   Basophils Absolute 60 0 - 200 cells/uL   Neutrophils Relative % 55.6 %   Total Lymphocyte 36.2 %   Monocytes Relative 6.3 %   Eosinophils Relative 1.2 %   Basophils Relative 0.7 %  HIV Antibody (routine testing w rflx)     Status: None   Collection Time: 02/10/21  4:24 PM  Result Value Ref Range   HIV 1&2 Ab, 4th Generation NON-REACTIVE NON-REACTI    Comment: HIV-1 antigen and HIV-1/HIV-2 antibodies were not detected. There is no laboratory evidence of HIV infection. Marland Kitchen PLEASE NOTE: This information has been disclosed to you from records whose confidentiality may be protected by state law.  If your state requires such protection, then the state law prohibits you from making any further disclosure of the information without the specific written consent of the person to whom it pertains, or as otherwise permitted by law. A general authorization for the release of medical or other information is NOT sufficient for this purpose. . For additional information please refer to http://education.[facility].com/faq/FAQ106 (This link is being provided for informational/ educational purposes only.) . Marland Kitchen The performance of this assay has not been clinically validated in patients less than 34 years old. .   RPR     Status: None    Collection Time: 02/10/21  4:24 PM  Result Value  Ref Range   RPR Ser Ql NON-REACTIVE NON-REACTI  POCT Urinalysis Dipstick     Status: Abnormal   Collection Time: 03/16/21 11:45 AM  Result Value Ref Range   Color, UA Yellow    Clarity, UA Clear    Glucose, UA Negative Negative   Bilirubin, UA Negative    Ketones, UA Negative    Spec Grav, UA 1.010 1.010 - 1.025   Blood, UA Negative    pH, UA 7.5 5.0 - 8.0   Protein, UA Negative Negative   Urobilinogen, UA 0.2 0.2 or 1.0 E.U./dL   Nitrite, UA Negative    Leukocytes, UA Trace (A) Negative   Appearance     Odor        Fall Risk: Fall Risk  03/16/2021 02/10/2021 11/02/2020 08/19/2019 08/15/2019  Falls in the past year? 0 0 0 0 0  Number falls in past yr: 0 0 0 0 0  Injury with Fall? 0 0 0 0 0  Follow up - - - Falls evaluation completed -     Functional Status Survey: Is the patient deaf or have difficulty hearing?: No Does the patient have difficulty seeing, even when wearing glasses/contacts?: No Does the patient have difficulty concentrating, remembering, or making decisions?: No Does the patient have difficulty walking or climbing stairs?: No Does the patient have difficulty dressing or bathing?: No Does the patient have difficulty doing errands alone such as visiting a doctor's office or shopping?: No   Assessment & Plan  1. Well adult exam  - Pneumococcal polysaccharide vaccine 23-valent greater than or equal to 2yo subcutaneous/IM  2. Cervical cancer screening  - Cytology - PAP  3. Dysuria  - POCT Urinalysis Dipstick - CULTURE, URINE COMPREHENSIVE  4. Urinary frequency  - CULTURE, URINE COMPREHENSIVE  5. Vaginal discharge  - Cytology - PAP  6. Colon cancer screening  She has follow up soon   7. Need for 23-polyvalent pneumococcal polysaccharide vaccine  - Pneumococcal polysaccharide vaccine 23-valent greater than or equal to 2yo subcutaneous/IM  -USPSTF grade A and B recommendations reviewed  with patient; age-appropriate recommendations, preventive care, screening tests, etc discussed and encouraged; healthy living encouraged; see AVS for patient education given to patient -Discussed importance of 150 minutes of physical activity weekly, eat two servings of fish weekly, eat one serving of tree nuts ( cashews, pistachios, pecans, almonds.Marland Kitchen) every other day, eat 6 servings of fruit/vegetables daily and drink plenty of water and avoid sweet beverages.

## 2021-03-16 ENCOUNTER — Other Ambulatory Visit (HOSPITAL_COMMUNITY)
Admission: RE | Admit: 2021-03-16 | Discharge: 2021-03-16 | Disposition: A | Discharge status: Home / Self Care | Payer: 59 | Source: Ambulatory Visit | Attending: Family Medicine | Admitting: Family Medicine

## 2021-03-16 ENCOUNTER — Encounter: Payer: Self-pay | Admitting: Family Medicine

## 2021-03-16 ENCOUNTER — Ambulatory Visit (INDEPENDENT_AMBULATORY_CARE_PROVIDER_SITE_OTHER): Payer: 59 | Admitting: Family Medicine

## 2021-03-16 ENCOUNTER — Other Ambulatory Visit: Payer: Self-pay

## 2021-03-16 VITALS — BP 124/76 | HR 81 | Temp 98.1°F | Resp 16 | Ht 64.0 in | Wt 216.5 lb

## 2021-03-16 DIAGNOSIS — Z1231 Encounter for screening mammogram for malignant neoplasm of breast: Secondary | ICD-10-CM

## 2021-03-16 DIAGNOSIS — R300 Dysuria: Secondary | ICD-10-CM | POA: Diagnosis not present

## 2021-03-16 DIAGNOSIS — Z1211 Encounter for screening for malignant neoplasm of colon: Secondary | ICD-10-CM

## 2021-03-16 DIAGNOSIS — Z124 Encounter for screening for malignant neoplasm of cervix: Secondary | ICD-10-CM | POA: Insufficient documentation

## 2021-03-16 DIAGNOSIS — N898 Other specified noninflammatory disorders of vagina: Secondary | ICD-10-CM

## 2021-03-16 DIAGNOSIS — R350 Frequency of micturition: Secondary | ICD-10-CM

## 2021-03-16 DIAGNOSIS — Z23 Encounter for immunization: Secondary | ICD-10-CM

## 2021-03-16 DIAGNOSIS — Z0000 Encounter for general adult medical examination without abnormal findings: Secondary | ICD-10-CM | POA: Diagnosis not present

## 2021-03-16 LAB — POCT URINALYSIS DIPSTICK
Bilirubin, UA: NEGATIVE
Blood, UA: NEGATIVE
Glucose, UA: NEGATIVE
Ketones, UA: NEGATIVE
Nitrite, UA: NEGATIVE
Protein, UA: NEGATIVE
Spec Grav, UA: 1.01 (ref 1.010–1.025)
Urobilinogen, UA: 0.2 E.U./dL
pH, UA: 7.5 (ref 5.0–8.0)

## 2021-03-16 NOTE — Patient Instructions (Signed)
Preventive Care 48-48 Years Old, Female Preventive care refers to lifestyle choices and visits with your health care provider that can promote health and wellness. This includes:  A yearly physical exam. This is also called an annual wellness visit.  Regular dental and eye exams.  Immunizations.  Screening for certain conditions.  Healthy lifestyle choices, such as: ? Eating a healthy diet. ? Getting regular exercise. ? Not using drugs or products that contain nicotine and tobacco. ? Limiting alcohol use. What can I expect for my preventive care visit? Physical exam Your health care provider will check your:  Height and weight. These may be used to calculate your BMI (body mass index). BMI is a measurement that tells if you are at a healthy weight.  Heart rate and blood pressure.  Body temperature.  Skin for abnormal spots. Counseling Your health care provider may ask you questions about your:  Past medical problems.  Family's medical history.  Alcohol, tobacco, and drug use.  Emotional well-being.  Home life and relationship well-being.  Sexual activity.  Diet, exercise, and sleep habits.  Work and work Statistician.  Access to firearms.  Method of birth control.  Menstrual cycle.  Pregnancy history. What immunizations do I need? Vaccines are usually given at various ages, according to a schedule. Your health care provider will recommend vaccines for you based on your age, medical history, and lifestyle or other factors, such as travel or where you work.   What tests do I need? Blood tests  Lipid and cholesterol levels. These may be checked every 5 years, or more often if you are over 3 years old.  Hepatitis C test.  Hepatitis B test. Screening  Lung cancer screening. You may have this screening every year starting at age 73 if you have a 30-pack-year history of smoking and currently smoke or have quit within the past 15 years.  Colorectal cancer  screening. ? All adults should have this screening starting at age 52 and continuing until age 17. ? Your health care provider may recommend screening at age 49 if you are at increased risk. ? You will have tests every 1-10 years, depending on your results and the type of screening test.  Diabetes screening. ? This is done by checking your blood sugar (glucose) after you have not eaten for a while (fasting). ? You may have this done every 1-3 years.  Mammogram. ? This may be done every 1-2 years. ? Talk with your health care provider about when you should start having regular mammograms. This may depend on whether you have a family history of breast cancer.  BRCA-related cancer screening. This may be done if you have a family history of breast, ovarian, tubal, or peritoneal cancers.  Pelvic exam and Pap test. ? This may be done every 3 years starting at age 10. ? Starting at age 11, this may be done every 5 years if you have a Pap test in combination with an HPV test. Other tests  STD (sexually transmitted disease) testing, if you are at risk.  Bone density scan. This is done to screen for osteoporosis. You may have this scan if you are at high risk for osteoporosis. Talk with your health care provider about your test results, treatment options, and if necessary, the need for more tests. Follow these instructions at home: Eating and drinking  Eat a diet that includes fresh fruits and vegetables, whole grains, lean protein, and low-fat dairy products.  Take vitamin and mineral supplements  as recommended by your health care provider.  Do not drink alcohol if: ? Your health care provider tells you not to drink. ? You are pregnant, may be pregnant, or are planning to become pregnant.  If you drink alcohol: ? Limit how much you have to 0-1 drink a day. ? Be aware of how much alcohol is in your drink. In the [place] one drink equals one 12 oz bottle of beer (355 mL), one 5 oz glass of  wine (148 mL), or one 1 oz glass of hard liquor (44 mL).   Lifestyle  Take daily care of your teeth and gums. Brush your teeth every morning and night with fluoride toothpaste. Floss one time each day.  Stay active. Exercise for at least 30 minutes 5 or more days each week.  Do not use any products that contain nicotine or tobacco, such as cigarettes, e-cigarettes, and chewing tobacco. If you need help quitting, ask your health care provider.  Do not use drugs.  If you are sexually active, practice safe sex. Use a condom or other form of protection to prevent STIs (sexually transmitted infections).  If you do not wish to become pregnant, use a form of birth control. If you plan to become pregnant, see your health care provider for a prepregnancy visit.  If told by your health care provider, take low-dose aspirin daily starting at age 50.  Find healthy ways to cope with stress, such as: ? Meditation, yoga, or listening to music. ? Journaling. ? Talking to a trusted person. ? Spending time with friends and family. Safety  Always wear your seat belt while driving or riding in a vehicle.  Do not drive: ? If you have been drinking alcohol. Do not ride with someone who has been drinking. ? When you are tired or distracted. ? While texting.  Wear a helmet and other protective equipment during sports activities.  If you have firearms in your house, make sure you follow all gun safety procedures. What's next?  Visit your health care provider once a year for an annual wellness visit.  Ask your health care provider how often you should have your eyes and teeth checked.  Stay up to date on all vaccines. This information is not intended to replace advice given to you by your health care provider. Make sure you discuss any questions you have with your health care provider. Document Revised: 09/08/2020 Document Reviewed: 08/16/2018 Elsevier Patient Education  2021 [employer].  

## 2021-03-17 LAB — CYTOLOGY - PAP
Chlamydia: NEGATIVE
Comment: NEGATIVE
Comment: NEGATIVE
Comment: NEGATIVE
Comment: NORMAL
Diagnosis: NEGATIVE
High risk HPV: NEGATIVE
Neisseria Gonorrhea: NEGATIVE
Trichomonas: NEGATIVE

## 2021-03-19 LAB — CULTURE, URINE COMPREHENSIVE
MICRO NUMBER:: 11706962
SPECIMEN QUALITY:: ADEQUATE

## 2021-03-21 ENCOUNTER — Other Ambulatory Visit: Payer: Self-pay | Admitting: Family Medicine

## 2021-03-21 MED ORDER — AMOXICILLIN 500 MG PO CAPS: 500.0000 mg | ORAL_CAPSULE | Freq: Two times a day (BID) | ORAL | 0 refills | Status: DC

## 2021-04-06 ENCOUNTER — Other Ambulatory Visit: Payer: Self-pay

## 2021-04-06 ENCOUNTER — Ambulatory Visit: Payer: 59 | Admitting: Gastroenterology

## 2021-04-06 ENCOUNTER — Encounter: Payer: Self-pay | Admitting: Gastroenterology

## 2021-04-06 VITALS — BP 98/63 | HR 71 | Ht 64.0 in | Wt 214.4 lb

## 2021-04-06 DIAGNOSIS — K594 Anal spasm: Secondary | ICD-10-CM

## 2021-04-06 DIAGNOSIS — K5904 Chronic idiopathic constipation: Secondary | ICD-10-CM | POA: Diagnosis not present

## 2021-04-06 DIAGNOSIS — K2100 Gastro-esophageal reflux disease with esophagitis, without bleeding: Secondary | ICD-10-CM

## 2021-04-06 MED ORDER — RECTIV 0.4 % RE OINT: TOPICAL_OINTMENT | RECTAL | 0 refills | Status: DC

## 2021-04-06 NOTE — Progress Notes (Signed)
Gastroenterology Consultation  Referring Provider:     Steele Sizer, MD Primary Care Physician:  Steele Sizer, MD Primary Gastroenterologist:  Dr. Allen Norris     Reason for Consultation:     Constipation        HPI:   Hannah Terry is a 50 y.o. y/o female referred for consultation & management of Constipation by Dr. Ancil Boozer, Drue Stager, MD.  This patient comes in with a history of long-standing constipation.  The patient states that it will go sometimes as long as a week before she has a bowel movement.  The patient also reports that she has intermittent bouts of rectal pain which happened at various times a day and usually last for 15 minutes.  There is no report of any bright red blood per rectum unexplained weight loss fevers chills nausea or vomiting.  She also denies any family history of colon cancer colon polyps.The patient reports that when she is constipated she does have bloating and abdominal discomfort.  She states that this has been going on a very long time and has become her norm.  She does take laxatives when this happens which resulted in her having bowel movements.  Past Medical History:  Diagnosis Date  . Acanthosis nigricans   . B12 deficiency   . Chronic vaginitis   . Multiple sclerosis ([place])    Lesion on C Spine  . Obesity   . Vitamin D deficiency     History reviewed. No pertinent surgical history.  Prior to Admission medications   Medication Sig Start Date End Date Taking? Authorizing Provider  amoxicillin (AMOXIL) 500 MG capsule Take 1 capsule (500 mg total) by mouth 2 (two) times daily. 03/21/21  Yes Sowles, Drue Stager, MD  Azelastine-Fluticasone [facility]) 137-50 MCG/ACT SUSP Place 1 spray into the nose 2 (two) times daily. 02/10/21  Yes Sowles, Drue Stager, MD  Cholecalciferol (VITAMIN D) 2000 units CAPS Take 1 capsule (2,000 Units total) by mouth daily. 08/22/17  Yes Sowles, Drue Stager, MD  cyclobenzaprine (FLEXERIL) 10 MG tablet Take 1 tablet (10 mg total) by mouth  at bedtime as needed for muscle spasms. 02/10/21  Yes Sowles, Drue Stager, MD  hydrOXYzine (ATARAX/VISTARIL) 10 MG tablet Take 1 tablet (10 mg total) by mouth 3 (three) times daily as needed. 02/10/21  Yes Sowles, Drue Stager, MD  levocetirizine (XYZAL) 5 MG tablet Take 1 tablet (5 mg total) by mouth every evening. 02/07/17  Yes Steele Sizer, MD  levonorgestrel (MIRENA) 20 MCG/24HR IUD 1 each by Intrauterine route. 06/22/14  Yes [provider]  meloxicam (MOBIC) 15 MG tablet Take 1 tablet (15 mg total) by mouth daily. 02/10/21  Yes Sowles, Drue Stager, MD  Nitroglycerin (RECTIV) 0.4 % OINT Apply 1 inch of ointment intra-anally as needed 04/06/21  Yes Lucilla Lame, MD  Vitamin D, Ergocalciferol, (DRISDOL) 1.25 MG (50000 UNIT) CAPS capsule Take 1 capsule (50,000 Units total) by mouth every 7 (seven) days. 02/11/21  Yes Steele Sizer, MD    Family History  Problem Relation Age of Onset  . Alcohol abuse Mother   . Alcohol abuse Father   . Drug abuse Father   . Heart disease Maternal Grandfather      Social History   Tobacco Use  . Smoking status: Never Smoker  . Smokeless tobacco: Former Systems developer    Types: Snuff, Chew  Vaping Use  . Vaping Use: Never used  Substance Use Topics  . Alcohol use: No    Alcohol/week: 0.0 standard drinks  . Drug use: No  Allergies as of 04/06/2021 - Review Complete 04/06/2021  Allergen Reaction Noted  . Ozempic [semaglutide]  08/17/2018    Review of Systems:    All systems reviewed and negative except where noted in HPI.   Physical Exam:  BP 98/63   Pulse 71   Ht 5\' 4"  (1.626 m)   Wt 214 lb 6.4 oz (97.3 kg)   BMI 36.80 kg/m  No LMP recorded. (Menstrual status: IUD). General:   Alert,  Well-developed, well-nourished, pleasant and cooperative in NAD Head:  Normocephalic and atraumatic. Eyes:  Sclera clear, no icterus.   Conjunctiva pink. Ears:  Normal auditory acuity. Neck:  Supple; no masses or thyromegaly. Lungs:  Respirations even and unlabored.   Clear throughout to auscultation.   No wheezes, crackles, or rhonchi. No acute distress. Heart:  Regular rate and rhythm; no murmurs, clicks, rubs, or gallops. Abdomen:  Normal bowel sounds.  No bruits.  Soft, non-tender and non-distended without masses, hepatosplenomegaly or hernias noted.  No guarding or rebound tenderness.  Negative Carnett sign.   Rectal:  Deferred.  Pulses:  Normal pulses noted. Extremities:  No clubbing or edema.  No cyanosis. Neurologic:  Alert and oriented x3;  grossly normal neurologically. Skin:  Intact without significant lesions or rashes.  No jaundice. Lymph Nodes:  No significant cervical adenopathy. Psych:  Alert and cooperative. Normal mood and affect.  Imaging Studies: No results found.  Assessment and Plan:   Hannah Terry is a 50 y.o. y/o female who comes in with chronic constipation.  The patient will be started on Linzess 145 g daily.  She will also be given a prescription for routine for her report of rectal spasms. The patient has been told that the routine can cause her to have headaches.  The patient also will need to be set up for a screening colonoscopy since she has never had a screening colonoscopy in the past.  The patient has been explained the plan and agrees with it.    Lucilla Lame, MD. Marval Regal    Note: This dictation was prepared with Dragon dictation along with smaller phrase technology. Any transcriptional errors that result from this process are unintentional.

## 2021-04-07 ENCOUNTER — Telehealth: Payer: Self-pay

## 2021-04-07 MED ORDER — LINACLOTIDE 145 MCG PO CAPS: 145.0000 ug | ORAL_CAPSULE | Freq: Every day | ORAL | 0 refills | Status: DC

## 2021-04-07 NOTE — Telephone Encounter (Signed)
Patient wants to cancel her procedure and reschedule for May. Patient also wants to know if she should continue taking the [place]? Says she started taking it on yest and it worked 30 minutes later. However the slightest snacks make her feel super full. Please call to advise

## 2021-04-07 NOTE — Addendum Note (Signed)
Addended byGlennie Isle E on: 04/07/2021 01:34 PM   Modules accepted: Orders

## 2021-04-08 ENCOUNTER — Telehealth: Payer: Self-pay

## 2021-04-08 ENCOUNTER — Other Ambulatory Visit: Payer: Self-pay

## 2021-04-08 NOTE — Telephone Encounter (Signed)
Pt lvm 04/07/21 stating she took the four pills and they worked well.  She did not know if she needed to remain on the pills.  Requested to reschedule her 04/23/21 procedure.  Thanks,  [place], [place]

## 2021-04-08 NOTE — Telephone Encounter (Signed)
LVM for pt to return my call.

## 2021-04-15 ENCOUNTER — Other Ambulatory Visit: Payer: Self-pay

## 2021-04-15 ENCOUNTER — Encounter: Payer: Self-pay | Admitting: Gastroenterology

## 2021-04-22 NOTE — Discharge Instructions (Signed)

## 2021-04-23 ENCOUNTER — Encounter
Admission: RE | Disposition: A | Discharge status: Home / Self Care | Payer: Self-pay | Source: Home / Self Care | Attending: Gastroenterology

## 2021-04-23 ENCOUNTER — Ambulatory Visit
Admission: RE | Admit: 2021-04-23 | Discharge: 2021-04-23 | Disposition: A | Discharge status: Home / Self Care | Payer: 59 | Attending: Gastroenterology | Admitting: Gastroenterology

## 2021-04-23 ENCOUNTER — Ambulatory Visit: Payer: 59 | Admitting: Anesthesiology

## 2021-04-23 ENCOUNTER — Encounter: Payer: Self-pay | Admitting: Gastroenterology

## 2021-04-23 ENCOUNTER — Other Ambulatory Visit: Payer: Self-pay

## 2021-04-23 DIAGNOSIS — R12 Heartburn: Secondary | ICD-10-CM | POA: Insufficient documentation

## 2021-04-23 DIAGNOSIS — K2970 Gastritis, unspecified, without bleeding: Secondary | ICD-10-CM | POA: Insufficient documentation

## 2021-04-23 DIAGNOSIS — K640 First degree hemorrhoids: Secondary | ICD-10-CM | POA: Diagnosis not present

## 2021-04-23 DIAGNOSIS — K635 Polyp of colon: Secondary | ICD-10-CM

## 2021-04-23 DIAGNOSIS — D126 Benign neoplasm of colon, unspecified: Secondary | ICD-10-CM | POA: Diagnosis not present

## 2021-04-23 DIAGNOSIS — Z79899 Other long term (current) drug therapy: Secondary | ICD-10-CM | POA: Insufficient documentation

## 2021-04-23 DIAGNOSIS — Z1211 Encounter for screening for malignant neoplasm of colon: Secondary | ICD-10-CM | POA: Diagnosis not present

## 2021-04-23 DIAGNOSIS — K31A11 Gastric intestinal metaplasia without dysplasia, involving the antrum: Secondary | ICD-10-CM | POA: Insufficient documentation

## 2021-04-23 DIAGNOSIS — B9681 Helicobacter pylori [H. pylori] as the cause of diseases classified elsewhere: Secondary | ICD-10-CM | POA: Diagnosis not present

## 2021-04-23 DIAGNOSIS — Z791 Long term (current) use of non-steroidal anti-inflammatories (NSAID): Secondary | ICD-10-CM | POA: Insufficient documentation

## 2021-04-23 DIAGNOSIS — Z888 Allergy status to other drugs, medicaments and biological substances status: Secondary | ICD-10-CM | POA: Diagnosis not present

## 2021-04-23 DIAGNOSIS — K449 Diaphragmatic hernia without obstruction or gangrene: Secondary | ICD-10-CM | POA: Insufficient documentation

## 2021-04-23 DIAGNOSIS — K2100 Gastro-esophageal reflux disease with esophagitis, without bleeding: Secondary | ICD-10-CM | POA: Diagnosis not present

## 2021-04-23 DIAGNOSIS — K219 Gastro-esophageal reflux disease without esophagitis: Secondary | ICD-10-CM | POA: Diagnosis not present

## 2021-04-23 HISTORY — PX: COLONOSCOPY WITH PROPOFOL: SHX5780

## 2021-04-23 HISTORY — PX: ESOPHAGOGASTRODUODENOSCOPY (EGD) WITH PROPOFOL: SHX5813

## 2021-04-23 HISTORY — PX: POLYPECTOMY: SHX5525

## 2021-04-23 LAB — POCT PREGNANCY, URINE: Preg Test, Ur: NEGATIVE

## 2021-04-23 SURGERY — COLONOSCOPY WITH PROPOFOL
Anesthesia: General

## 2021-04-23 MED ORDER — LACTATED RINGERS IV SOLN: INTRAVENOUS | Status: DC

## 2021-04-23 MED ORDER — ACETAMINOPHEN 160 MG/5ML PO SOLN: 325.0000 mg | ORAL | Status: DC | PRN

## 2021-04-23 MED ORDER — SODIUM CHLORIDE 0.9 % IV SOLN: INTRAVENOUS | Status: DC

## 2021-04-23 MED ORDER — STERILE WATER FOR IRRIGATION IR SOLN
Status: DC | PRN
  Administered 2021-04-23: 1 mL

## 2021-04-23 MED ORDER — ACETAMINOPHEN 325 MG PO TABS: 325.0000 mg | ORAL_TABLET | ORAL | Status: DC | PRN

## 2021-04-23 MED ORDER — PROPOFOL 10 MG/ML IV BOLUS
INTRAVENOUS | Status: DC | PRN
  Administered 2021-04-23: 100 mg via INTRAVENOUS
  Administered 2021-04-23 (×6): 20 mg via INTRAVENOUS

## 2021-04-23 MED ORDER — LIDOCAINE HCL (CARDIAC) PF 100 MG/5ML IV SOSY
PREFILLED_SYRINGE | INTRAVENOUS | Status: DC | PRN
  Administered 2021-04-23: 20 mg via INTRAVENOUS

## 2021-04-23 SURGICAL SUPPLY — 38 items
BALLN DILATOR 10-12 8 (BALLOONS)
BALLN DILATOR 12-15 8 (BALLOONS)
BALLN DILATOR 15-18 8 (BALLOONS)
BALLN DILATOR CRE 0-12 8 (BALLOONS)
BALLN DILATOR ESOPH 8 10 CRE (MISCELLANEOUS) IMPLANT
BALLOON DILATOR 12-15 8 (BALLOONS) IMPLANT
BALLOON DILATOR 15-18 8 (BALLOONS) IMPLANT
BALLOON DILATOR CRE 0-12 8 (BALLOONS) IMPLANT
BLOCK BITE 60FR ADLT L/F GRN (MISCELLANEOUS) ×3 IMPLANT
CLIP HMST 235XBRD CATH ROT (MISCELLANEOUS) IMPLANT
CLIP RESOLUTION 360 11X235 (MISCELLANEOUS)
ELECT REM PT RETURN 9FT ADLT (ELECTROSURGICAL)
ELECTRODE REM PT RTRN 9FT ADLT (ELECTROSURGICAL) IMPLANT
FCP ESCP3.2XJMB 240X2.8X (MISCELLANEOUS)
FORCEPS BIOP RAD 4 LRG CAP 4 (CUTTING FORCEPS) ×3 IMPLANT
FORCEPS BIOP RJ4 240 W/NDL (MISCELLANEOUS)
FORCEPS ESCP3.2XJMB 240X2.8X (MISCELLANEOUS) IMPLANT
GOWN CVR UNV OPN BCK APRN NK (MISCELLANEOUS) ×4 IMPLANT
GOWN ISOL THUMB LOOP REG UNIV (MISCELLANEOUS) ×6
INJECTOR VARIJECT VIN23 (MISCELLANEOUS) IMPLANT
KIT DEFENDO VALVE AND CONN (KITS) IMPLANT
KIT PRC NS LF DISP ENDO (KITS) ×2 IMPLANT
KIT PROCEDURE OLYMPUS (KITS) ×3
MANIFOLD NEPTUNE II (INSTRUMENTS) ×3 IMPLANT
MARKER SPOT ENDO TATTOO 5ML (MISCELLANEOUS) IMPLANT
PROBE APC STR FIRE (PROBE) IMPLANT
RETRIEVER NET PLAT FOOD (MISCELLANEOUS) IMPLANT
RETRIEVER NET ROTH 2.5X230 LF (MISCELLANEOUS) IMPLANT
SNARE COLD EXACTO (MISCELLANEOUS) ×3 IMPLANT
SNARE SHORT THROW 13M SML OVAL (MISCELLANEOUS) IMPLANT
SNARE SHORT THROW 30M LRG OVAL (MISCELLANEOUS) ×3 IMPLANT
SNARE SNG USE RND 15MM (INSTRUMENTS) IMPLANT
SPOT EX ENDOSCOPIC TATTOO (MISCELLANEOUS)
SYR INFLATION 60ML (SYRINGE) IMPLANT
TRAP ETRAP POLY (MISCELLANEOUS) ×3 IMPLANT
VARIJECT INJECTOR VIN23 (MISCELLANEOUS)
WATER STERILE IRR 250ML POUR (IV SOLUTION) ×3 IMPLANT
WIRE CRE 18-20MM 8CM F G (MISCELLANEOUS) IMPLANT

## 2021-04-23 NOTE — Anesthesia Postprocedure Evaluation (Signed)
Anesthesia Post Note  Patient: Hannah Terry  Procedure(s) Performed: COLONOSCOPY WITH PROPOFOL (N/A ) ESOPHAGOGASTRODUODENOSCOPY (EGD) WITH PROPOFOL (N/A ) POLYPECTOMY     Patient location during evaluation: PACU Anesthesia Type: General Level of consciousness: awake and alert Pain management: pain level controlled Vital Signs Assessment: post-procedure vital signs reviewed and stable Respiratory status: spontaneous breathing, nonlabored ventilation, respiratory function stable and patient connected to nasal cannula oxygen Cardiovascular status: blood pressure returned to baseline and stable Postop Assessment: no apparent nausea or vomiting Anesthetic complications: no   No complications documented.  Trecia Rogers

## 2021-04-23 NOTE — Anesthesia Procedure Notes (Signed)
Procedure Name: MAC Date/Time: 04/23/2021 7:51 AM Performed by: Vanetta Shawl, CRNA Pre-anesthesia Checklist: Patient identified, Emergency Drugs available, Suction available, Timeout performed and Patient being monitored Patient Re-evaluated:Patient Re-evaluated prior to induction Oxygen Delivery Method: Nasal cannula Placement Confirmation: positive ETCO2

## 2021-04-23 NOTE — Op Note (Signed)
[facility] Gastroenterology Patient Name: Hannah Terry Procedure Date: 04/23/2021 7:15 AM MRN: [id number] Account #: [id number] Date of Birth: 12-23-1970 Admit Type: Outpatient Age: 50 Room: [facility] OR ROOM 01 Gender: Female Note Status: Finalized Procedure:             Colonoscopy Indications:           Screening for colorectal malignant neoplasm Providers:             Lucilla Lame MD, MD Medicines:             Propofol per Anesthesia Complications:         No immediate complications. Procedure:             Pre-Anesthesia Assessment:                        - Prior to the procedure, a History and Physical was                         performed, and patient medications and allergies were                         reviewed. The patient's tolerance of previous                         anesthesia was also reviewed. The risks and benefits                         of the procedure and the sedation options and risks                         were discussed with the patient. All questions were                         answered, and informed consent was obtained. Prior                         Anticoagulants: The patient has taken no previous                         anticoagulant or antiplatelet agents. ASA Grade                         Assessment: II - A patient with mild systemic disease.                         After reviewing the risks and benefits, the patient                         was deemed in satisfactory condition to undergo the                         procedure.                        After obtaining informed consent, the colonoscope was                         passed under direct vision. Throughout the procedure,  the patient's blood pressure, pulse, and oxygen                         saturations were monitored continuously. The was                         introduced through the anus and advanced to the the                         cecum, identified by  appendiceal orifice and ileocecal                         valve. The colonoscopy was performed without                         difficulty. The patient tolerated the procedure well.                         The quality of the bowel preparation was excellent. Findings:      The perianal and digital rectal examinations were normal.      A 6 mm polyp was found in the ileocecal valve. The polyp was sessile.       The polyp was removed with a cold biopsy forceps. The polyp was removed       with a cold snare. Resection and retrieval were complete.      Non-bleeding internal hemorrhoids were found during retroflexion. The       hemorrhoids were Grade I (internal hemorrhoids that do not prolapse). Impression:            - One 6 mm polyp at the ileocecal valve, removed with                         a cold snare and removed with a cold biopsy forceps.                         Resected and retrieved.                        - Non-bleeding internal hemorrhoids. Recommendation:        - Discharge patient to home.                        - Resume previous diet.                        - Continue present medications.                        - Await pathology results.                        - Repeat colonoscopy in 7 years for surveillance if                         adenomatous and 10 years if hyperplastic. Procedure Code(s):     --- Professional ---                        [phone], Colonoscopy, flexible; with removal of  tumor(s), polyp(s), or other lesion(s) by snare                         technique Diagnosis Code(s):     --- Professional ---                        Z12.11, Encounter for screening for malignant neoplasm                         of colon                        K63.5, Polyp of colon CPT copyright 2019 American Medical Association. All rights reserved. The codes documented in this report are preliminary and upon coder review may  be revised to meet current compliance  requirements. Lucilla Lame MD, MD 04/23/2021 8:16:51 AM This report has been signed electronically. Number of Addenda: 0 Note Initiated On: 04/23/2021 7:15 AM Scope Withdrawal Time: 0 hours 11 minutes 4 seconds  Total Procedure Duration: 0 hours 15 minutes 19 seconds  Estimated Blood Loss:  Estimated blood loss: none.      [facility]

## 2021-04-23 NOTE — Transfer of Care (Signed)
Immediate Anesthesia Transfer of Care Note  Patient: Hannah Terry  Procedure(s) Performed: COLONOSCOPY WITH PROPOFOL (N/A ) ESOPHAGOGASTRODUODENOSCOPY (EGD) WITH PROPOFOL (N/A )  Patient Location: PACU  Anesthesia Type: General  Level of Consciousness: awake, alert  and patient cooperative  Airway and Oxygen Therapy: Patient Spontanous Breathing   Post-op Assessment: Post-op Vital signs reviewed, Patient's Cardiovascular Status Stable, Respiratory Function Stable, Patent Airway and No signs of Nausea or vomiting  Post-op Vital Signs: Reviewed and stable  Complications: No complications documented.

## 2021-04-23 NOTE — Op Note (Signed)
[facility] Gastroenterology Patient Name: Hannah Terry Procedure Date: 04/23/2021 7:14 AM MRN: [id number] Account #: [id number] Date of Birth: 03/07/1971 Admit Type: Outpatient Age: 50 Room: [facility] OR ROOM 01 Gender: Female Note Status: Finalized Procedure:             Upper GI endoscopy Indications:           Heartburn Providers:             Lucilla Lame MD, MD Referring MD:          Bethena Roys. Sowles, MD (Referring MD) Medicines:             Propofol per Anesthesia Complications:         No immediate complications. Procedure:             Pre-Anesthesia Assessment:                        - Prior to the procedure, a History and Physical was                         performed, and patient medications and allergies were                         reviewed. The patient's tolerance of previous                         anesthesia was also reviewed. The risks and benefits                         of the procedure and the sedation options and risks                         were discussed with the patient. All questions were                         answered, and informed consent was obtained. Prior                         Anticoagulants: The patient has taken no previous                         anticoagulant or antiplatelet agents. ASA Grade                         Assessment: II - A patient with mild systemic disease.                         After reviewing the risks and benefits, the patient                         was deemed in satisfactory condition to undergo the                         procedure.                        After obtaining informed consent, the endoscope was  passed under direct vision. Throughout the procedure,                         the patient's blood pressure, pulse, and oxygen                         saturations were monitored continuously. The was                         introduced through the mouth, and advanced to the                          second part of duodenum. The upper GI endoscopy was                         accomplished without difficulty. The patient tolerated                         the procedure well. Findings:      A medium-sized hiatal hernia was present.      Segmental moderate inflammation characterized by erythema was found in       the gastric antrum. Biopsies were taken with a cold forceps for       histology.      The examined duodenum was normal. Impression:            - Medium-sized hiatal hernia.                        - Gastritis. Biopsied.                        - Normal examined duodenum. Recommendation:        - Discharge patient to home.                        - Resume previous diet.                        - Continue present medications.                        - Await pathology results.                        - Perform a colonoscopy today. Procedure Code(s):     --- Professional ---                        [phone], Esophagogastroduodenoscopy, flexible,                         transoral; with biopsy, single or multiple Diagnosis Code(s):     --- Professional ---                        R12, Heartburn                        K29.70, Gastritis, unspecified, without bleeding CPT copyright 2019 American Medical Association. All rights reserved. The codes documented in this report are preliminary and upon coder review may  be revised to meet current compliance requirements. Saraya Tirey  Arlee Bossard MD, MD 04/23/2021 7:56:51 AM This report has been signed electronically. Number of Addenda: 0 Note Initiated On: 04/23/2021 7:14 AM Total Procedure Duration: 0 hours 2 minutes 1 second  Estimated Blood Loss:  Estimated blood loss: none.      [facility]

## 2021-04-23 NOTE — H&P (Signed)
Lucilla Lame, MD [address] [place], [place] [address] [phone] Fax : [phone]  Primary Care Physician:  Steele Sizer, MD Primary Gastroenterologist:  Dr. Allen Norris  Pre-Procedure History & Physical: HPI:  Hannah Terry is a 50 y.o. female is here for an endoscopy and colonoscopy.   Past Medical History:  Diagnosis Date  . Acanthosis nigricans   . B12 deficiency   . Chronic vaginitis   . Multiple sclerosis ([place])    Lesion on C Spine  . Obesity   . Vitamin D deficiency     History reviewed. No pertinent surgical history.  Prior to Admission medications   Medication Sig Start Date End Date Taking? Authorizing Provider  amoxicillin (AMOXIL) 500 MG capsule Take 1 capsule (500 mg total) by mouth 2 (two) times daily. 03/21/21  Yes Sowles, Drue Stager, MD  Azelastine-Fluticasone [facility]) 137-50 MCG/ACT SUSP Place 1 spray into the nose 2 (two) times daily. 02/10/21  Yes Sowles, Drue Stager, MD  Cholecalciferol (VITAMIN D) 2000 units CAPS Take 1 capsule (2,000 Units total) by mouth daily. 08/22/17  Yes Sowles, Drue Stager, MD  cyclobenzaprine (FLEXERIL) 10 MG tablet Take 1 tablet (10 mg total) by mouth at bedtime as needed for muscle spasms. 02/10/21  Yes Sowles, Drue Stager, MD  hydrOXYzine (ATARAX/VISTARIL) 10 MG tablet Take 1 tablet (10 mg total) by mouth 3 (three) times daily as needed. 02/10/21  Yes Sowles, Drue Stager, MD  levocetirizine (XYZAL) 5 MG tablet Take 1 tablet (5 mg total) by mouth every evening. 02/07/17  Yes Sowles, Drue Stager, MD  linaclotide [facility] [place] [facility]) 145 MCG CAPS capsule Take 1 capsule (145 mcg total) by mouth daily before breakfast. 04/07/21  Yes Lucilla Lame, MD  meloxicam (MOBIC) 15 MG tablet Take 1 tablet (15 mg total) by mouth daily. 02/10/21  Yes Sowles, Drue Stager, MD  Vitamin D, Ergocalciferol, (DRISDOL) 1.25 MG (50000 UNIT) CAPS capsule Take 1 capsule (50,000 Units total) by mouth every 7 (seven) days. 02/11/21  Yes Steele Sizer, MD  levonorgestrel  (MIRENA) 20 MCG/24HR IUD 1 each by Intrauterine route. 06/22/14   [provider]  Nitroglycerin (RECTIV) 0.4 % OINT Apply 1 inch of ointment intra-anally as needed 04/06/21   Lucilla Lame, MD    Allergies as of 04/07/2021 - Review Complete 04/06/2021  Allergen Reaction Noted  . Ozempic [semaglutide]  08/17/2018    Family History  Problem Relation Age of Onset  . Alcohol abuse Mother   . Alcohol abuse Father   . Drug abuse Father   . Heart disease Maternal Grandfather     Social History   Socioeconomic History  . Marital status: Divorced    Spouse name: Not on file  . Number of children: 4  . Years of education: Not on file  . Highest education level: High school graduate  Occupational History  . Occupation: Financial planner: [employer]  Tobacco Use  . Smoking status: Never Smoker  . Smokeless tobacco: Former Systems developer    Types: Snuff, Chew  Vaping Use  . Vaping Use: Never used  Substance and Sexual Activity  . Alcohol use: No    Alcohol/week: 0.0 standard drinks  . Drug use: No  . Sexual activity: Not Currently  Other Topics Concern  . Not on file  Social History Narrative   She was married for  21 years, has 4 children.   Separated since 11/2017, struggling financially since, divorce final 05/20/2019   Social Determinants of Health   Financial Resource Strain: Medium Risk  .  Difficulty of Paying Living Expenses: Somewhat hard  Food Insecurity: Food Insecurity Present  . Worried About Charity fundraiser in the Last Year: Sometimes true  . Ran Out of Food in the Last Year: Never true  Transportation Needs: No Transportation Needs  . Lack of Transportation (Medical): No  . Lack of Transportation (Non-Medical): No  Physical Activity: Sufficiently Active  . Days of Exercise per Week: 7 days  . Minutes of Exercise per Session: 50 min  Stress: No Stress Concern Present  . Feeling of Stress : Not at all  Social Connections:  Moderately Integrated  . Frequency of Communication with Friends and Family: More than three times a week  . Frequency of Social Gatherings with Friends and Family: Twice a week  . Attends Religious Services: More than 4 times per year  . Active Member of Clubs or Organizations: Yes  . Attends Archivist Meetings: 1 to 4 times per year  . Marital Status: Divorced  Human resources officer Violence: Not At Risk  . Fear of Current or Ex-Partner: No  . Emotionally Abused: No  . Physically Abused: No  . Sexually Abused: No    Review of Systems: See HPI, otherwise negative ROS  Physical Exam: BP 125/69   Pulse 73   Temp 97.8 F (36.6 C) (Temporal)   Ht 5\' 5"  (1.651 m)   Wt 96.2 kg   SpO2 99%   BMI 35.28 kg/m  General:   Alert,  pleasant and cooperative in NAD Head:  Normocephalic and atraumatic. Neck:  Supple; no masses or thyromegaly. Lungs:  Clear throughout to auscultation.    Heart:  Regular rate and rhythm. Abdomen:  Soft, nontender and nondistended. Normal bowel sounds, without guarding, and without rebound.   Neurologic:  Alert and  oriented x4;  grossly normal neurologically.  Impression/Plan: Hannah Terry is here for an endoscopy and colonoscopy to be performed for screening and GERD  Risks, benefits, limitations, and alternatives regarding  endoscopy and colonoscopy have been reviewed with the patient.  Questions have been answered.  All parties agreeable.   Lucilla Lame, MD  04/23/2021, 7:43 AM

## 2021-04-23 NOTE — Anesthesia Preprocedure Evaluation (Signed)
Anesthesia Evaluation  Patient identified by MRN, date of birth, ID band Patient awake    Reviewed: Allergy & Precautions, H&P , NPO status , Patient's Chart, lab work & pertinent test results, reviewed documented beta blocker date and time   Airway Mallampati: I  TM Distance: >3 FB Neck ROM: full    Dental no notable dental hx.    Pulmonary neg pulmonary ROS,    Pulmonary exam normal breath sounds clear to auscultation       Cardiovascular Exercise Tolerance: Good negative cardio ROS   Rhythm:regular Rate:Normal     Neuro/Psych ? Multiple Sclerosis dx.  No symptoms now negative psych ROS   GI/Hepatic Neg liver ROS, GERD  ,  Endo/Other  negative endocrine ROS  Renal/GU negative Renal ROS  negative genitourinary   Musculoskeletal   Abdominal   Peds  Hematology negative hematology ROS (+)   Anesthesia Other Findings   Reproductive/Obstetrics negative OB ROS                             Anesthesia Physical Anesthesia Plan  ASA: II  Anesthesia Plan: General   Post-op Pain Management:    Induction:   PONV Risk Score and Plan:   Airway Management Planned:   Additional Equipment:   Intra-op Plan:   Post-operative Plan:   Informed Consent: I have reviewed the patients History and Physical, chart, labs and discussed the procedure including the risks, benefits and alternatives for the proposed anesthesia with the patient or authorized representative who has indicated his/her understanding and acceptance.     Dental Advisory Given  Plan Discussed with: CRNA and Anesthesiologist  Anesthesia Plan Comments:         Anesthesia Quick Evaluation

## 2021-04-26 ENCOUNTER — Encounter: Payer: Self-pay | Admitting: Gastroenterology

## 2021-04-28 LAB — SURGICAL PATHOLOGY

## 2021-05-04 ENCOUNTER — Other Ambulatory Visit: Payer: Self-pay

## 2021-05-04 ENCOUNTER — Telehealth: Payer: Self-pay

## 2021-05-04 MED ORDER — CLARITHROMYCIN 500 MG PO TABS: 500.0000 mg | ORAL_TABLET | Freq: Two times a day (BID) | ORAL | 0 refills | Status: DC

## 2021-05-04 MED ORDER — AMOXICILLIN 500 MG PO CAPS: 1000.0000 mg | ORAL_CAPSULE | Freq: Two times a day (BID) | ORAL | 0 refills | Status: DC

## 2021-05-04 MED ORDER — LANSOPRAZOLE 30 MG PO CPDR: 30.0000 mg | DELAYED_RELEASE_CAPSULE | Freq: Two times a day (BID) | ORAL | 2 refills | Status: AC

## 2021-05-04 NOTE — Telephone Encounter (Signed)
Pt notified of procedure results. Rx's have been sent to pt's pharmacy. Pt added to recall list.

## 2021-05-04 NOTE — Telephone Encounter (Signed)
-----   Message from Lucilla Lame, MD sent at 04/29/2021 10:31 AM EDT ----- Let the patient know that her polyp was precancerous and she needs a repeat colonoscopy in 7 years since that polyp was completely removed.  The repeat colonoscopy will look for any further polyp growth.  Her stomach biopsies showed her to have H. pylori and she should be treated for this.  She should also have a repeat upper endoscopy in 1 year after being treated for her H. pylori.

## 2021-07-01 ENCOUNTER — Telehealth: Payer: Self-pay

## 2021-07-01 NOTE — Telephone Encounter (Signed)
Copied from [place] [id number]. Topic: General - Other >> Jun 30, 2021  3:27 PM Pawlus, Brayton Layman A wrote: Reason for CRM: Pt had some questions regarding getting her hernia repaired, please advise

## 2021-07-02 NOTE — Telephone Encounter (Signed)
Spoke with pt and answered all questions. Will discuss at her next ov.

## 2021-07-05 NOTE — Progress Notes (Signed)
Name: Hannah Terry   MRN: [id number]    DOB: 07/14/1971   Date:07/06/2021       Progress Note  Subjective  Chief Complaint  Pelvic Pain  HPI  Pelvic pain: she states it feels like it is a pain in her bones/deep down in her pelvis. She has the same sexual partner was months ago, no vaginal discharge, she has an IUD and cycles are more like spotting. Pain is intermittent can be sharp, can feel like something is tearing and sometimes is aching and heaviness. She denies any bulging. No change in bowel movement or urinary frequency. No dysuria. She had coccyalgia and history of sciatica in the past, X-ray sacrum and coccyx was done in Feb 2022 and it was normal , negative gonorrhea and chlamydia in [place] and no intercourse since   History of h. 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   Obesity (BMI 30.0-34.9) 01/12/2016   MS (multiple sclerosis) ([place]) 01/12/2016   B12 deficiency 12/23/2015   IUD contraception 04/02/2015    Past Surgical History:  Procedure Laterality Date   COLONOSCOPY WITH PROPOFOL N/A 04/23/2021   Procedure: COLONOSCOPY WITH PROPOFOL;  Surgeon: Lucilla Lame, MD;  Location: [facility];  Service: Endoscopy;  Laterality: N/A;   ESOPHAGOGASTRODUODENOSCOPY (EGD) WITH PROPOFOL N/A 04/23/2021   Procedure: ESOPHAGOGASTRODUODENOSCOPY (EGD) WITH PROPOFOL;  Surgeon: Lucilla Lame, MD;  Location: [facility];  Service: Endoscopy;  Laterality: N/A;   POLYPECTOMY  04/23/2021   Procedure: POLYPECTOMY;  Surgeon: Lucilla Lame, MD;  Location[facility];  Service: Endoscopy;;    Family History  Problem Relation Age of Onset   Alcohol abuse Mother    Alcohol abuse Father    Drug abuse Father    Heart disease Maternal Grandfather     Social History   Tobacco Use   Smoking status: Never   Smokeless tobacco: Former    Types: Snuff, Chew  Substance Use Topics   Alcohol use: No    Alcohol/week: 0.0 standard drinks     Current Outpatient Medications:    amoxicillin (AMOXIL) 500 MG capsule, Take 2 capsules (1,000 mg total) by mouth 2 (two) times daily., Disp: 56 capsule, Rfl: 0   Azelastine-Fluticasone (DYMISTA) 137-50 MCG/ACT SUSP, Place 1 spray into the nose 2 (two) times daily., Disp: 23 g, Rfl: 2   Cholecalciferol (VITAMIN D) 2000 units CAPS, Take 1 capsule (2,000 Units total) by mouth daily., Disp: 30 capsule, Rfl: 0   clarithromycin (BIAXIN) 500 MG tablet, Take 1 tablet (500 mg total) by mouth 2 (two) times daily., Disp: 28 tablet, Rfl: 0   cyclobenzaprine (FLEXERIL) 10 MG tablet, Take 1 tablet (10 mg total) by mouth at bedtime as needed for muscle spasms., Disp: 30 tablet, Rfl: 0   hydrOXYzine (ATARAX/VISTARIL) 10 MG tablet, Take 1 tablet (10 mg total) by mouth 3 (three) times daily as needed., Disp: 90 tablet, Rfl: 0   lansoprazole (PREVACID) 30 MG capsule, Take 1 capsule (30 mg total) by mouth 2 (two) times daily before a meal., Disp: 60 capsule, Rfl: 2  levocetirizine (XYZAL) 5 MG tablet, Take 1 tablet (5 mg total) by mouth every evening., Disp: 30 tablet, Rfl: 2   levonorgestrel (MIRENA) 20 MCG/24HR IUD, 1 each by Intrauterine route., Disp: , Rfl:    linaclotide (LINZESS) 145 MCG CAPS capsule, Take 1 capsule (145 mcg total) by mouth daily before breakfast., Disp: 16 capsule, Rfl: 0   meloxicam (MOBIC) 15 MG tablet, Take 1 tablet (15 mg total) by mouth daily., Disp: 90 tablet, Rfl: 0   Nitroglycerin (RECTIV) 0.4 % OINT, Apply 1 inch of ointment intra-anally as needed, Disp: 30 g, Rfl:  0   Vitamin D, Ergocalciferol, (DRISDOL) 1.25 MG (50000 UNIT) CAPS capsule, Take 1 capsule (50,000 Units total) by mouth every 7 (seven) days., Disp: 12 capsule, Rfl: 1  Allergies  Allergen Reactions   Ozempic [Semaglutide]     headache    I personally reviewed active problem list, medication list, allergies, family history, social history, health maintenance with the patient/caregiver today.   ROS  Ten systems reviewed and is negative except as mentioned in HPI   Objective  Vitals:   07/06/21 1102  BP: 118/82  Pulse: 73  Resp: 16  Temp: (!) 97.5 F (36.4 C)  TempSrc: Oral  SpO2: 98%  Weight: 216 lb (98 kg)  Height: 5\' 4"  (1.626 m)    Body mass index is 37.08 kg/m.  Physical Exam  Constitutional: Patient appears well-developed and well-nourished. Obese  No distress.  HEENT: head atraumatic, normocephalic, pupils equal and reactive to light, neck supple Cardiovascular: Normal rate, regular rhythm and normal heart sounds.  No murmur heard. No BLE edema. Pulmonary/Chest: Effort normal and breath sounds normal. No respiratory distress. Abdominal/pelvic exam:normal cervix and vaginal walls, IUD not able to see strings, pain during bimanual exam, no masses  Psychiatric: Patient has a normal mood and affect. behavior is normal. Judgment and thought content normal.   Recent Results (from the past 2160 hour(s))  Pregnancy, urine POC     Status: None   Collection Time: 04/23/21  7:18 AM  Result Value Ref Range   Preg Test, Ur NEGATIVE NEGATIVE    Comment:        THE SENSITIVITY OF THIS METHODOLOGY IS >24 mIU/mL   Surgical pathology     Status: None   Collection Time: 04/23/21  8:00 AM  Result Value Ref Range   SURGICAL PATHOLOGY      SURGICAL PATHOLOGY CASE: [id number] PATIENT: Hannah Terry Surgical Pathology Report     Specimen Submitted: A. Stomach, antrum; cbx B. Colon polyp, ileocecal valve; cold snare  Clinical History: Heartburn. Findings: Gastritis,  ileocecal valve polyp     DIAGNOSIS: A. 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 - FOCAL INTESTINAL METAPLASIA (LESS THAN 1 MM) IN ONE OF THREE FRAGMENTS. - NEGATIVE FOR DYSPLASIA AND MALIGNANCY.  B. COLON POLYP, ILEOCECAL VALVE; COLD SNARE: - SESSILE SERRATED POLYP, MULTIPLE FRAGMENTS. - NEGATIVE FOR DYSPLASIA AND MALIGNANCY.  Comment: Sparse H. pylori bacteria are detected in the gastric biopsies (A) by immunohistochemistry (IHC). The organisms are mostly in the gastric pits and not on the surface, which can occur with medications including proton pump inhibitors and antibiotics. Clinical correlation is recommended.   IHC slides were prepared by Launa Grill, [place]. All controls stained appropriately.  This test was developed and its performance characteristics determined by [facility]. It has not been cleared or approved by the [place] Food and Drug Administration. The FDA does not  require this test to go through premarket FDA review. This test is used for clinical purposes. It should not be regarded as investigational or for research. This laboratory is certified under the Clinical Laboratory Improvement Amendments (CLIA) as qualified to perform high complexity clinical laboratory testing.  GROSS DESCRIPTION: A. Labeled: Antrum gastritis (per requisition cold biopsy forceps) Received: Formalin Collection time: 8 AM on 04/23/2021 Placed into formalin time: 8 AM on 04/23/2021 Tissue fragment(s): 2 Size: Each 0.5 cm Description: Tan soft tissue fragments Entirely submitted in 1 cassette.  B. Labeled: Ileocecal valve polyp cold snare Received: Formalin Collection time: 8:05 AM on 5/6/202 2 Placed into formalin time: 8:05 AM on 04/23/2021 Tissue fragment(s): Multiple Size: Aggregate, 0.8 x 0.4 x 0.2 cm Description: Tan soft tissue fragments Entirely submitted in 1  cassette.  RB 04/26/2021  Final Diagnosis performed by Bryan Lemma, MD.   Electronically signed 04/28/2021 3:31:18PM The electronic signature indicates that the named Attending Pathologist has evaluated the specimen Technical component performed at [facility], [address], [place], [place] [address] Lab: [phone] Dir: Rush Farmer, MD, MMM  Professional component performed at [facility], [facility], [place], [place], [place] [address] Lab: [phone] Dir: Dellia Nims. Rubinas, MD      PHQ2/9: Depression screen [facility] 2/9 07/06/2021 03/16/2021 02/10/2021 11/02/2020 08/19/2019  Decreased Interest 0 0 1 0 0  Down, Depressed, Hopeless 0 0 0 0 0  PHQ - 2 Score 0 0 1 0 0  Altered sleeping - 0 1 - 0  Tired, decreased energy - 0 2 - 0  Change in appetite - 0 0 - 0  Feeling bad or failure about yourself  - 0 0 - 0  Trouble concentrating - 0 0 - 0  Moving slowly or fidgety/restless - 0 0 - 0  Suicidal thoughts - 0 0 - 0  PHQ-9 Score - 0 4 - 0  Difficult doing work/chores - Not difficult at all Not difficult at all - Not difficult at all  Some recent data might be hidden    phq 9 is negative   Fall Risk: Fall Risk  07/06/2021 03/16/2021 02/10/2021 11/02/2020 08/19/2019  Falls in the past year? 1 0 0 0 0  Number falls in past yr: 0 0 0 0 0  Injury with Fall? 1 0 0 0 0  Follow up - - - - Falls evaluation completed     Functional Status Survey: Is the patient deaf or have difficulty hearing?: No Does the patient have difficulty seeing, even when wearing glasses/contacts?: No Does the patient have difficulty concentrating, remembering, or making decisions?: No Does the patient have difficulty walking or climbing stairs?: No Does the patient have difficulty dressing or bathing?: No Does the patient have difficulty doing errands alone such as visiting a doctor's office or shopping?: No    Assessment & Plan   1. Pelvic pain  Advised to take meloxicam for now  We  will look for urinary tract infection, IUD since not seen on speculum exam and CBC to see WBC - US PELVIC COMPLETE WITH TRANSVAGINAL; Future - Urine Culture - CBC with Differential/Platelet  2. History of Helicobacter pylori infection  - H. pylori breath test  3. Dyspepsia  - H. pylori breath test

## 2021-07-06 ENCOUNTER — Ambulatory Visit (INDEPENDENT_AMBULATORY_CARE_PROVIDER_SITE_OTHER): Payer: 59 | Admitting: Family Medicine

## 2021-07-06 ENCOUNTER — Other Ambulatory Visit: Payer: Self-pay

## 2021-07-06 ENCOUNTER — Encounter: Payer: Self-pay | Admitting: Family Medicine

## 2021-07-06 VITALS — BP 118/82 | HR 73 | Temp 97.5°F | Resp 16 | Ht 64.0 in | Wt 216.0 lb

## 2021-07-06 DIAGNOSIS — R102 Pelvic and perineal pain: Secondary | ICD-10-CM | POA: Diagnosis not present

## 2021-07-06 DIAGNOSIS — R1013 Epigastric pain: Secondary | ICD-10-CM

## 2021-07-06 DIAGNOSIS — Z8619 Personal history of other infectious and parasitic diseases: Secondary | ICD-10-CM | POA: Diagnosis not present

## 2021-07-07 LAB — CBC WITH DIFFERENTIAL/PLATELET
Absolute Monocytes: 562 cells/uL (ref 200–950)
Basophils Absolute: 62 cells/uL (ref 0–200)
Basophils Relative: 0.8 %
Eosinophils Absolute: 123 cells/uL (ref 15–500)
Eosinophils Relative: 1.6 %
HCT: 41.2 % (ref 35.0–45.0)
Hemoglobin: 14.1 g/dL (ref 11.7–15.5)
Lymphs Abs: 3219 cells/uL (ref 850–3900)
MCH: 30.9 pg (ref 27.0–33.0)
MCHC: 34.2 g/dL (ref 32.0–36.0)
MCV: 90.2 fL (ref 80.0–100.0)
MPV: 10.2 fL (ref 7.5–12.5)
Monocytes Relative: 7.3 %
Neutro Abs: 3735 cells/uL (ref 1500–7800)
Neutrophils Relative %: 48.5 %
Platelets: 277 10*3/uL (ref 140–400)
RBC: 4.57 10*6/uL (ref 3.80–5.10)
RDW: 11.9 % (ref 11.0–15.0)
Total Lymphocyte: 41.8 %
WBC: 7.7 10*3/uL (ref 3.8–10.8)

## 2021-07-07 LAB — URINE CULTURE
MICRO NUMBER:: 12137842
Result:: NO GROWTH
SPECIMEN QUALITY:: ADEQUATE

## 2021-07-07 LAB — H. PYLORI BREATH TEST: H. pylori Breath Test: NOT DETECTED

## 2021-07-14 ENCOUNTER — Telehealth: Payer: Self-pay | Admitting: Family Medicine

## 2021-07-14 NOTE — Telephone Encounter (Signed)
Pt is calling and would like to proceed  with MRI of pelvic. Pt seen dr Ancil Boozer on 07-06-2021 also pt works at hospital and would like to see if dr Ancil Boozer would order foot xray. [place] 07[place]04[place]21 and her left big toe is throbbing. Pt is aware she may have to have an appt before md would order foot xray. Please advise

## 2021-07-15 NOTE — Telephone Encounter (Signed)
Spoke with patient and made aware of [place]. She is scheduling a f/u for the foot pain/xray

## 2021-07-19 ENCOUNTER — Telehealth: Payer: Self-pay

## 2021-07-19 NOTE — Progress Notes (Signed)
foName: Hannah Terry   MRN: [id number]    DOB: 11/21/1971   Date:07/20/2021       Progress Note  Subjective  Chief Complaint  Follow Up- L Foot Pain  HPI  Left foot injury: she was walking from parking in lot to the side walk in [place] of [place]  on 07/04/2021, and and her left foot hit the curb and she fell forward, she scrapped both knees , and knee pain lasted a couple days however the left foot is still sore, she never developed any bruises. She states she has sharp pain that is shooting from her left toe to the left bunion area, it feels like a burning sensation and wakes her up at night . She has seen Dr. Cleda Mccreedy in the past to Podiatrist for plantar fascitis.   Pelvic pain: pelvic US scheduled for this week.   Recurrent sciatic right side: she takes flexeril prn, she also sees Dr. Joyce Copa, she states symptoms are intermittent , she still has coccygeal pain when sitting for a long time.   AR: only seasonal and no  problems at this time   Vitamin D deficiency: reminded her to take otc vitamin D 2000 units   MS: she was on Tacfidera but stopped on her own. She was seeing Dr. Manuella Ghazi but lost to follow up. She states initial had paresthesia on feet, up to legs and after that both hands. She on on high dose gabapentin but stopped on her own, she states symptoms are not as severe now   B12 deficiency:  last level was 334, advised B12 SL 500 mcg three times a week.   Dyslipidemia: not on medications, reviewed last labs , still on life style modification.   The 10-year ASCVD risk score Mikey Bussing DC Brooke Bonito., et al., 2013) is: 1.3%   Values used to calculate the score:     Age: 50 years     Sex: Female     Is Non-Hispanic African American: Yes     Diabetic: No     Tobacco smoker: No     Systolic Blood Pressure: [id number] mmHg     Is BP treated: No     HDL Cholesterol: 47 mg/dL     Total Cholesterol: 231 mg/dL   Patient Active Problem List   Diagnosis Date Noted   Special screening for malignant  neoplasms, colon    Polyp of ascending colon    Gastroesophageal reflux disease with esophagitis without hemorrhage    Gastritis without bleeding    Allergic rhinitis, seasonal 05/08/2017   Vitamin D deficiency 02/07/2017   Encounter for routine checking of intrauterine contraceptive device (IUD) 03/31/2016   Acanthosis nigricans 02/22/2016   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   Obesity (BMI 30.0-34.9) 01/12/2016   MS (multiple sclerosis) ([place]) 01/12/2016   B12 deficiency 12/23/2015   IUD contraception 04/02/2015    Past Surgical History:  Procedure Laterality Date   COLONOSCOPY WITH PROPOFOL N/A 04/23/2021   Procedure: COLONOSCOPY WITH PROPOFOL;  Surgeon: Lucilla Lame, MD;  Location: [facility];  Service: Endoscopy;  Laterality: N/A;   ESOPHAGOGASTRODUODENOSCOPY (EGD) WITH PROPOFOL N/A 04/23/2021   Procedure: ESOPHAGOGASTRODUODENOSCOPY (EGD) WITH PROPOFOL;  Surgeon: Lucilla Lame, MD;  Location: [place];  Service: Endoscopy;  Laterality: N/A;   POLYPECTOMY  04/23/2021   Procedure: POLYPECTOMY;  Surgeon: Lucilla Lame, MD;  Location: [facility];  Service: Endoscopy;;    Family History  Problem Relation Age of Onset   Alcohol abuse Mother    Alcohol abuse  Father    Drug abuse Father    Heart disease Maternal Grandfather     Social History   Tobacco Use   Smoking status: Never   Smokeless tobacco: Former    Types: Snuff, Chew  Substance Use Topics   Alcohol use: No    Alcohol/week: 0.0 standard drinks     Current Outpatient Medications:    Azelastine-Fluticasone (DYMISTA) 137-50 MCG/ACT SUSP, Place 1 spray into the nose 2 (two) times daily., Disp: 23 g, Rfl: 2   Cholecalciferol (VITAMIN D) 2000 units CAPS, Take 1 capsule (2,000 Units total) by mouth daily., Disp: 30 capsule, Rfl: 0   cyclobenzaprine (FLEXERIL) 10 MG tablet, Take 1 tablet (10 mg total) by mouth at bedtime as needed for muscle spasms., Disp: 30 tablet, Rfl: 0   hydrOXYzine (ATARAX/VISTARIL)  10 MG tablet, Take 1 tablet (10 mg total) by mouth 3 (three) times daily as needed., Disp: 90 tablet, Rfl: 0   lansoprazole (PREVACID) 30 MG capsule, Take 1 capsule (30 mg total) by mouth 2 (two) times daily before a meal., Disp: 60 capsule, Rfl: 2   levocetirizine (XYZAL) 5 MG tablet, Take 1 tablet (5 mg total) by mouth every evening., Disp: 30 tablet, Rfl: 2   levonorgestrel (MIRENA) 20 MCG/24HR IUD, 1 each by Intrauterine route., Disp: , Rfl:    linaclotide (LINZESS) 145 MCG CAPS capsule, Take 1 capsule (145 mcg total) by mouth daily before breakfast., Disp: 16 capsule, Rfl: 0   meloxicam (MOBIC) 15 MG tablet, Take 1 tablet (15 mg total) by mouth daily., Disp: 90 tablet, Rfl: 0   Nitroglycerin (RECTIV) 0.4 % OINT, Apply 1 inch of ointment intra-anally as needed (Patient not taking: Reported on 07/20/2021), Disp: 30 g, Rfl: 0   Vitamin D, Ergocalciferol, (DRISDOL) 1.25 MG (50000 UNIT) CAPS capsule, Take 1 capsule (50,000 Units total) by mouth every 7 (seven) days. (Patient not taking: Reported on 07/20/2021), Disp: 12 capsule, Rfl: 1  Allergies  Allergen Reactions   Ozempic [Semaglutide]     headache    I personally reviewed active problem list, medication list, allergies, family history, social history, health maintenance with the patient/caregiver today.   ROS  Constitutional: Negative for fever or weight change.  Respiratory: Negative for cough and shortness of breath.   Cardiovascular: Negative for chest pain or palpitations.  Gastrointestinal: Negative for abdominal pain, no bowel changes.  Musculoskeletal: positive for gait problem but no joint swelling.  Skin: Negative for rash.  Neurological: Negative for dizziness or headache.  No other specific complaints in a complete review of systems (except as listed in HPI above).   Objective  Vitals:   07/20/21 0757  BP: 112/80  Pulse: 92  Resp: 16  Temp: 98.3 F (36.8 C)  SpO2: 98%  Weight: 213 lb (96.6 kg)  Height: '5\' 4"'$  (1.626  m)    Body mass index is 36.56 kg/m.  Physical Exam  Constitutional: Patient appears well-developed and well-nourished. Obese  No distress.  HEENT: head atraumatic, normocephalic, pupils equal and reactive to light, neck supple Cardiovascular: Normal rate, regular rhythm and normal heart sounds.  No murmur heard. No BLE edema. Pulmonary/Chest: Effort normal and breath sounds normal. No respiratory distress. Abdominal: Soft.  There is no tenderness. Psychiatric: Patient has a normal mood and affect. behavior is normal. Judgment and thought content normal.  Muscular skeletal: normal rom of toes, normal distal pulse, no redness, pain during palpation of left bunion   Recent Results (from the past 2160 hour(s))  Pregnancy,  urine POC     Status: None   Collection Time: 04/23/21  7:18 AM  Result Value Ref Range   Preg Test, Ur NEGATIVE NEGATIVE    Comment:        THE SENSITIVITY OF THIS METHODOLOGY IS >24 mIU/mL   Surgical pathology     Status: None   Collection Time: 04/23/21  8:00 AM  Result Value Ref Range   SURGICAL PATHOLOGY      SURGICAL PATHOLOGY CASE: [id number] PATIENT: Eniola Kilburg Surgical Pathology Report     Specimen Submitted: A. Stomach, antrum; cbx B. Colon polyp, ileocecal valve; cold snare  Clinical History: Heartburn. Findings: Gastritis, ileocecal valve polyp     DIAGNOSIS: A. 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 - FOCAL INTESTINAL METAPLASIA (LESS THAN 1 MM) IN ONE OF THREE FRAGMENTS. - NEGATIVE FOR DYSPLASIA AND MALIGNANCY.  B. COLON POLYP, ILEOCECAL VALVE; COLD SNARE: - SESSILE SERRATED POLYP, MULTIPLE FRAGMENTS. - NEGATIVE FOR DYSPLASIA AND MALIGNANCY.  Comment: Sparse H. pylori bacteria are detected in the gastric biopsies (A) by immunohistochemistry (IHC). The organisms are mostly in the gastric pits and not on the  surface, which can occur with medications including proton pump inhibitors and antibiotics. Clinical correlation is recommended.   IHC slides were prepared by Launa Grill, [place]. All controls stained appropriately.  This test was developed and its performance characteristics determined by [facility]. It has not been cleared or approved by the [place] Food and Drug Administration. The FDA does not require this test to go through premarket FDA review. This test is used for clinical purposes. It should not be regarded as investigational or for research. This laboratory is certified under the Clinical Laboratory Improvement Amendments (CLIA) as qualified to perform high complexity clinical laboratory testing.  GROSS DESCRIPTION: A. Labeled: Antrum gastritis (per requisition cold biopsy forceps) Received: Formalin Collection time: 8 AM on 04/23/2021 Placed into formalin time: 8 AM on 04/23/2021 Tissue fragment(s): 2 Size: Each 0.5 cm Description: Tan soft tissue fragments Entirely submitted in 1 cassette.  B. Labeled: Ileocecal valve polyp cold snare Received: Formalin Collection time: 8:05 AM on 5/6/202 2 Placed into formalin time: 8:05 AM on 04/23/2021 Tissue fragment(s): Multiple Size: Aggregate, 0.8 x 0.4 x 0.2 cm Description: Tan soft tissue fragments Entirely submitted in 1 cassette.  RB 04/26/2021  Final Diagnosis performed by Bryan Lemma, MD.   Electronically signed 04/28/2021 3:31:18PM The electronic signature indicates that the named Attending Pathologist has evaluated the specimen Technical component performed at [facility], [address] [place], [place] [address] Lab: [phone] Dir: Rush Farmer, MD, MMM  Professional component performed at [facility], [place], [place], [place] [address] Lab: [phone] Dir: Dellia Nims. Rubinas, MD   H. pylori breath test     Status: None   Collection Time: 07/06/21 11:52 AM  Result Value Ref Range   H.  pylori Breath Test NOT DETECTED NOT DETECTED    Comment: . Antimicrobials, proton pump inhibitors, and bismuth preparations are known to suppress H. pylori, and  ingestion of these prior to H. pylori diagnostic testing may lead to false negative results. If clinically  indicated, the test may be repeated on a new specimen obtained two weeks after discontinuing treatment. However, a positive result is still clinically valid.   Urine Culture     Status: None   Collection Time: 07/06/21 11:52 AM   Specimen: Urine  Result Value Ref Range  MICRO NUMBER: [id number]    SPECIMEN QUALITY: Adequate    Sample Source URINE    STATUS: FINAL    Result: No Growth   CBC with Differential/Platelet     Status: None   Collection Time: 07/06/21 11:52 AM  Result Value Ref Range   WBC 7.7 3.8 - 10.8 Thousand/uL   RBC 4.57 3.80 - 5.10 Million/uL   Hemoglobin 14.1 11.7 - 15.5 g/dL   HCT 41.2 35.0 - 45.0 %   MCV 90.2 80.0 - 100.0 fL   MCH 30.9 27.0 - 33.0 pg   MCHC 34.2 32.0 - 36.0 g/dL   RDW 11.9 11.0 - 15.0 %   Platelets 277 140 - 400 Thousand/uL   MPV 10.2 7.5 - 12.5 fL   Neutro Abs 3,735 1,500 - 7,800 cells/uL   Lymphs Abs 3,219 850 - 3,900 cells/uL   Absolute Monocytes 562 200 - 950 cells/uL   Eosinophils Absolute 123 15 - 500 cells/uL   Basophils Absolute 62 0 - 200 cells/uL   Neutrophils Relative % 48.5 %   Total Lymphocyte 41.8 %   Monocytes Relative 7.3 %   Eosinophils Relative 1.6 %   Basophils Relative 0.8 %     PHQ2/9: Depression screen [facility] 2/9 07/20/2021 07/06/2021 03/16/2021 02/10/2021 11/02/2020  Decreased Interest 0 0 0 1 0  Down, Depressed, Hopeless 0 0 0 0 0  PHQ - 2 Score 0 0 0 1 0  Altered sleeping - - 0 1 -  Tired, decreased energy - - 0 2 -  Change in appetite - - 0 0 -  Feeling bad or failure about yourself  - - 0 0 -  Trouble concentrating - - 0 0 -  Moving slowly or fidgety/restless - - 0 0 -  Suicidal thoughts - - 0 0 -  PHQ-9 Score - - 0 4 -  Difficult doing  work/chores - - Not difficult at all Not difficult at all -  Some recent data might be hidden    phq 9 is negative   Fall Risk: Fall Risk  07/20/2021 07/06/2021 03/16/2021 02/10/2021 11/02/2020  Falls in the past year? 1 1 0 0 0  Number falls in past yr: 0 0 0 0 0  Injury with Fall? 1 1 0 0 0  Follow up - - - - -      Functional Status Survey: Is the patient deaf or have difficulty hearing?: No Does the patient have difficulty seeing, even when wearing glasses/contacts?: No Does the patient have difficulty concentrating, remembering, or making decisions?: No Does the patient have difficulty walking or climbing stairs?: No Does the patient have difficulty dressing or bathing?: No Does the patient have difficulty doing errands alone such as visiting a doctor's office or shopping?: No    Assessment & Plan  1. Sciatica of right side  - meloxicam (MOBIC) 15 MG tablet; Take 1 tablet (15 mg total) by mouth daily.  Dispense: 90 tablet; Refill: 0  2. Left foot pain  - DG Foot Complete Right; Future  3. Vitamin D deficiency  - VITAMIN D 25 Hydroxy (Vit-D Deficiency, Fractures)  4. B12 deficiency  - Vitamin B12  5. MS (multiple sclerosis) ([place])   6. Dyslipidemia  - Lipid panel  7. Obesity (BMI 30.0-34.9)   8. Long-term use of high-risk medication  - COMPLETE METABOLIC PANEL WITH GFR  9. History of Helicobacter pylori infection

## 2021-07-19 NOTE — Telephone Encounter (Signed)
Spoke with pt and let her know a visit was authorized. Gave number to [facility] to check with them

## 2021-07-19 NOTE — Telephone Encounter (Signed)
Pt checking status on referral for ultra sound she has not heard anything. Please give pt a return call

## 2021-07-20 ENCOUNTER — Ambulatory Visit (INDEPENDENT_AMBULATORY_CARE_PROVIDER_SITE_OTHER): Payer: 59 | Admitting: Family Medicine

## 2021-07-20 ENCOUNTER — Other Ambulatory Visit: Payer: Self-pay

## 2021-07-20 ENCOUNTER — Encounter: Payer: Self-pay | Admitting: Family Medicine

## 2021-07-20 VITALS — BP 112/80 | HR 92 | Temp 98.3°F | Resp 16 | Ht 64.0 in | Wt 213.0 lb

## 2021-07-20 DIAGNOSIS — M5431 Sciatica, right side: Secondary | ICD-10-CM

## 2021-07-20 DIAGNOSIS — M79672 Pain in left foot: Secondary | ICD-10-CM

## 2021-07-20 DIAGNOSIS — E559 Vitamin D deficiency, unspecified: Secondary | ICD-10-CM

## 2021-07-20 DIAGNOSIS — E785 Hyperlipidemia, unspecified: Secondary | ICD-10-CM

## 2021-07-20 DIAGNOSIS — Z8619 Personal history of other infectious and parasitic diseases: Secondary | ICD-10-CM

## 2021-07-20 DIAGNOSIS — E669 Obesity, unspecified: Secondary | ICD-10-CM

## 2021-07-20 DIAGNOSIS — G35 Multiple sclerosis: Secondary | ICD-10-CM

## 2021-07-20 DIAGNOSIS — E538 Deficiency of other specified B group vitamins: Secondary | ICD-10-CM | POA: Diagnosis not present

## 2021-07-20 DIAGNOSIS — Z79899 Other long term (current) drug therapy: Secondary | ICD-10-CM

## 2021-07-20 MED ORDER — MELOXICAM 15 MG PO TABS: 15.0000 mg | ORAL_TABLET | Freq: Every day | ORAL | 0 refills | Status: DC

## 2021-07-20 NOTE — Patient Instructions (Signed)
Vitamin  D 2000 units daily  B 12 500 mcg three times a week

## 2021-07-22 ENCOUNTER — Ambulatory Visit
Admission: RE | Admit: 2021-07-22 | Discharge: 2021-07-22 | Disposition: A | Discharge status: Home / Self Care | Payer: 59 | Source: Ambulatory Visit | Attending: Family Medicine | Admitting: Family Medicine

## 2021-07-22 ENCOUNTER — Other Ambulatory Visit: Payer: Self-pay

## 2021-07-22 ENCOUNTER — Ambulatory Visit
Admission: RE | Admit: 2021-07-22 | Discharge: 2021-07-22 | Disposition: A | Discharge status: Home / Self Care | Payer: 59 | Source: Home / Self Care | Attending: Family Medicine | Admitting: Family Medicine

## 2021-07-22 ENCOUNTER — Other Ambulatory Visit: Payer: Self-pay | Admitting: Family Medicine

## 2021-07-22 DIAGNOSIS — M79672 Pain in left foot: Secondary | ICD-10-CM

## 2021-07-22 DIAGNOSIS — R102 Pelvic and perineal pain: Secondary | ICD-10-CM | POA: Insufficient documentation

## 2021-07-26 ENCOUNTER — Other Ambulatory Visit: Payer: Self-pay

## 2021-07-26 DIAGNOSIS — N83202 Unspecified ovarian cyst, left side: Secondary | ICD-10-CM

## 2021-08-31 ENCOUNTER — Encounter: Payer: Self-pay | Admitting: Family Medicine

## 2021-09-02 ENCOUNTER — Ambulatory Visit: Payer: 59

## 2021-09-08 ENCOUNTER — Other Ambulatory Visit
Admission: RE | Admit: 2021-09-08 | Discharge: 2021-09-08 | Disposition: A | Discharge status: Home / Self Care | Payer: 59 | Source: Ambulatory Visit | Attending: Family Medicine | Admitting: Family Medicine

## 2021-09-08 DIAGNOSIS — Z79899 Other long term (current) drug therapy: Secondary | ICD-10-CM | POA: Insufficient documentation

## 2021-09-08 DIAGNOSIS — E785 Hyperlipidemia, unspecified: Secondary | ICD-10-CM | POA: Diagnosis not present

## 2021-09-08 DIAGNOSIS — E538 Deficiency of other specified B group vitamins: Secondary | ICD-10-CM | POA: Diagnosis not present

## 2021-09-08 DIAGNOSIS — E559 Vitamin D deficiency, unspecified: Secondary | ICD-10-CM | POA: Diagnosis present

## 2021-09-08 LAB — VITAMIN D 25 HYDROXY (VIT D DEFICIENCY, FRACTURES): Vit D, 25-Hydroxy: 38.01 ng/mL (ref 30–100)

## 2021-09-08 LAB — COMPREHENSIVE METABOLIC PANEL
ALT: 20 U/L (ref 0–44)
AST: 21 U/L (ref 15–41)
Albumin: 4.3 g/dL (ref 3.5–5.0)
Alkaline Phosphatase: 80 U/L (ref 38–126)
Anion gap: 10 (ref 5–15)
BUN: 9 mg/dL (ref 6–20)
CO2: 26 mmol/L (ref 22–32)
Calcium: 9 mg/dL (ref 8.9–10.3)
Chloride: 102 mmol/L (ref 98–111)
Creatinine, Ser: 0.88 mg/dL (ref 0.44–1.00)
GFR, Estimated: >60 mL/min (ref >60)
Glucose, Bld: 144 mg/dL — ABNORMAL HIGH (ref 70–99)
Potassium: 3.7 mmol/L (ref 3.5–5.1)
Sodium: 138 mmol/L (ref 135–145)
Total Bilirubin: 0.7 mg/dL (ref 0.3–1.2)
Total Protein: 7.7 g/dL (ref 6.5–8.1)

## 2021-09-08 LAB — LIPID PANEL
Cholesterol: 213 mg/dL — ABNORMAL HIGH (ref 0–200)
HDL: 43 mg/dL (ref >40)
LDL Cholesterol: 138 mg/dL — ABNORMAL HIGH (ref 0–99)
Total CHOL/HDL Ratio: 5 RATIO
Triglycerides: 158 mg/dL — ABNORMAL HIGH (ref <150)
VLDL: 32 mg/dL (ref 0–40)

## 2021-09-08 LAB — VITAMIN B12: Vitamin B-12: 220 pg/mL (ref 180–914)

## 2021-09-09 ENCOUNTER — Telehealth: Payer: Self-pay

## 2021-09-09 NOTE — Telephone Encounter (Signed)
-----   Message from Steele Sizer, MD sent at 09/09/2021 11:44 AM EDT ----- Please add A1C if she was fasting, diagnosis hyperglycemia The 10-year ASCVD risk score (Arnett DK, et al., 2019) is: 1.4%   Values used to calculate the score:     Age: 50 years     Sex: Female     Is Non-Hispanic African American: Yes     Diabetic: No     Tobacco smoker: No     Systolic Blood Pressure: 601 mmHg     Is BP treated: No     HDL Cholesterol: 43 mg/dL     Total Cholesterol: 213 mg/dL

## 2021-09-09 NOTE — Telephone Encounter (Signed)
Left voicemail for return call to go over lab results per Dr. Ancil Boozer.

## 2021-09-14 ENCOUNTER — Telehealth: Payer: Self-pay

## 2021-09-14 NOTE — Telephone Encounter (Signed)
Error

## 2021-09-16 ENCOUNTER — Ambulatory Visit: Payer: 59 | Admitting: Family Medicine

## 2021-10-01 ENCOUNTER — Telehealth: Payer: Self-pay

## 2021-10-01 NOTE — Telephone Encounter (Signed)
Copied from [place] [phone]. Topic: General - Inquiry >> Oct 01, 2021  4:52 PM Greggory Keen D wrote: Reason for CRM: Pt called asking about the xray of her foot that she had done a bout a month ago.  She is still having foot pain  CB#  [phone]

## 2021-10-06 ENCOUNTER — Encounter: Payer: Self-pay | Admitting: Family Medicine

## 2021-10-07 ENCOUNTER — Other Ambulatory Visit: Payer: Self-pay | Admitting: Family Medicine

## 2021-10-07 DIAGNOSIS — M79672 Pain in left foot: Secondary | ICD-10-CM

## 2021-10-07 DIAGNOSIS — M21612 Bunion of left foot: Secondary | ICD-10-CM

## 2021-10-21 IMAGING — US US PELVIS COMPLETE WITH TRANSVAGINAL
1 series · 13 of 25 positions shown · non-contrast
Comparison: None

CLINICAL DATA: Pelvic pain for several weeks.  IUD for 2 years.

EXAM:
TRANSABDOMINAL AND TRANSVAGINAL ULTRASOUND OF PELVIS
TECHNIQUE: Both transabdominal and transvaginal ultrasound examinations of the
pelvis were performed. Transabdominal technique was performed for
global imaging of the pelvis including uterus, ovaries, adnexal
regions, and pelvic cul-de-sac. It was necessary to proceed with
endovaginal exam following the transabdominal exam to visualize the
endometrium and ovaries.

[Series 1: us pelvis complete with transvaginal · 0.24mm/px · 117 acquisitions, 13 frames shown]
[im 1/117]
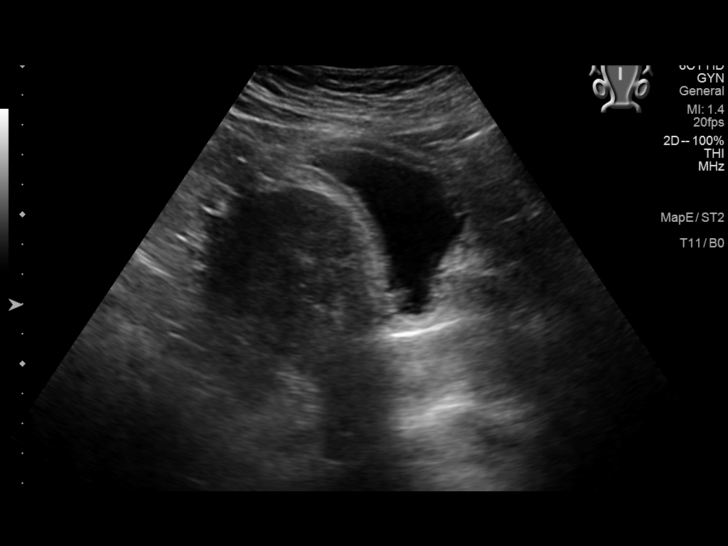
[im 10/117]
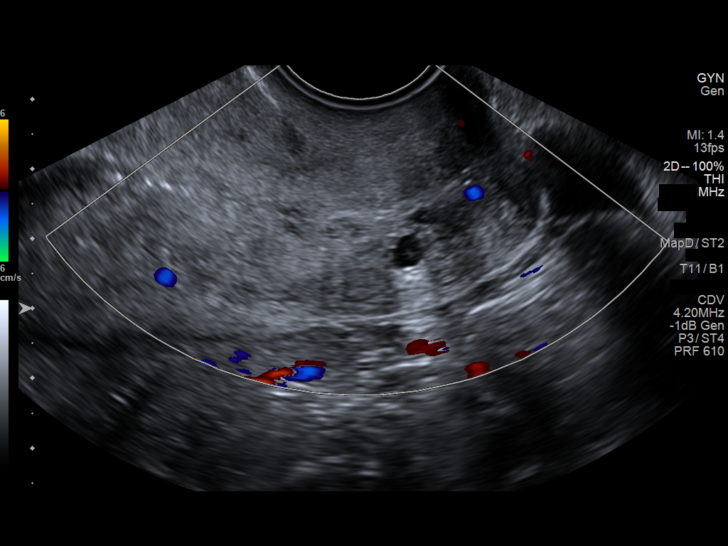
[im 20/117]
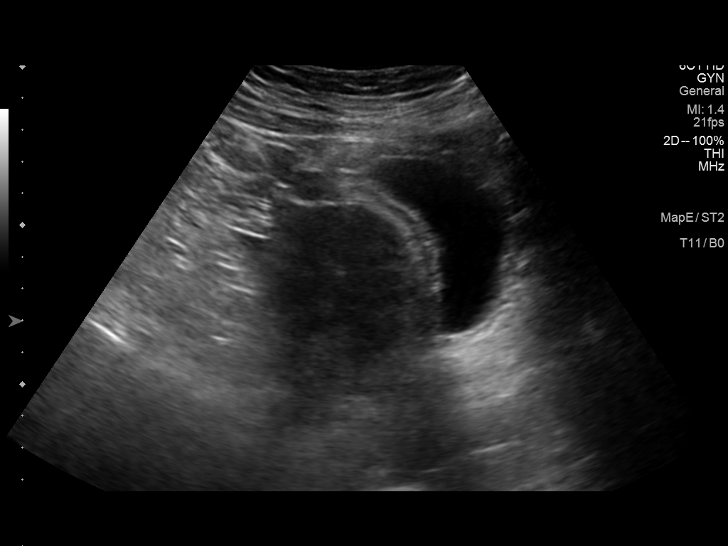
[im 30/117]
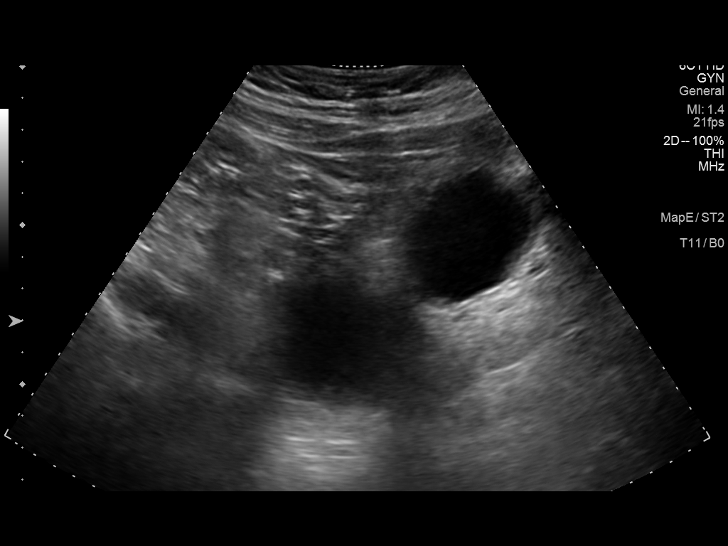
[im 39/117]
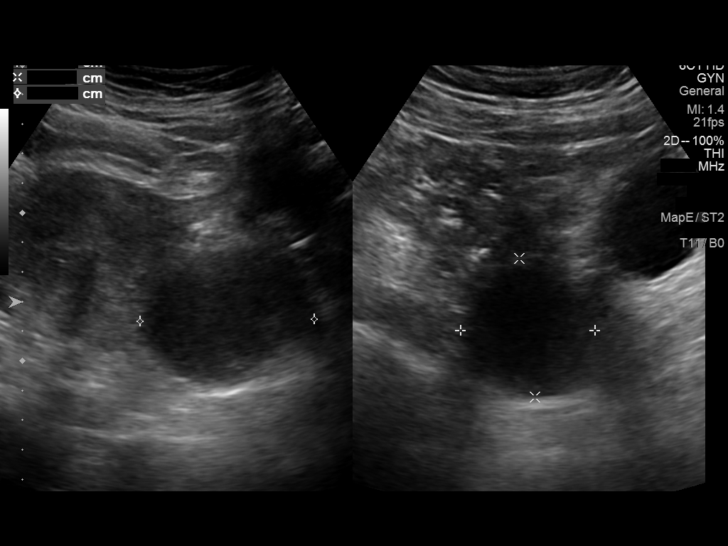
[im 49/117]
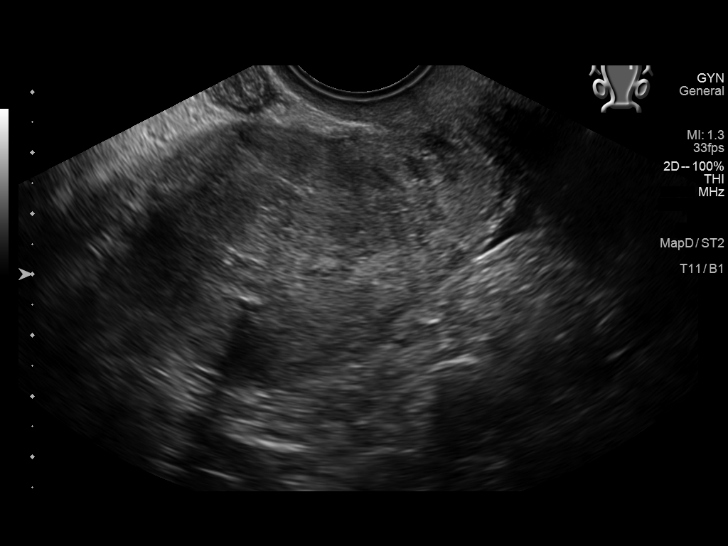
[im 59/117]
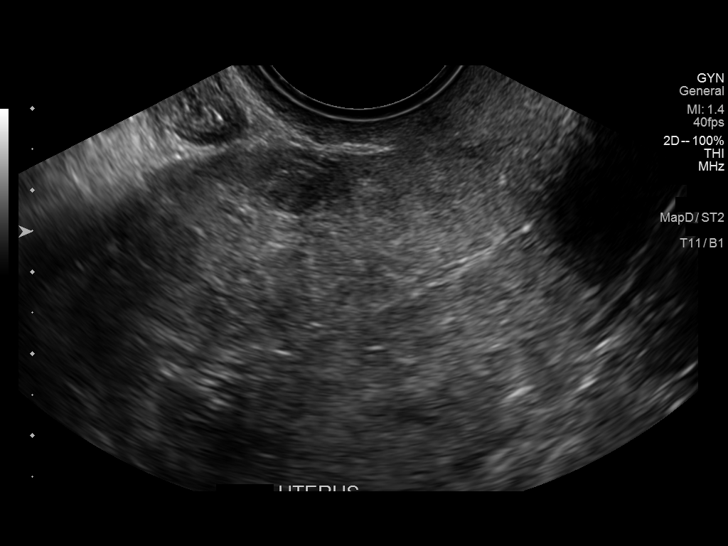
[im 68/117]
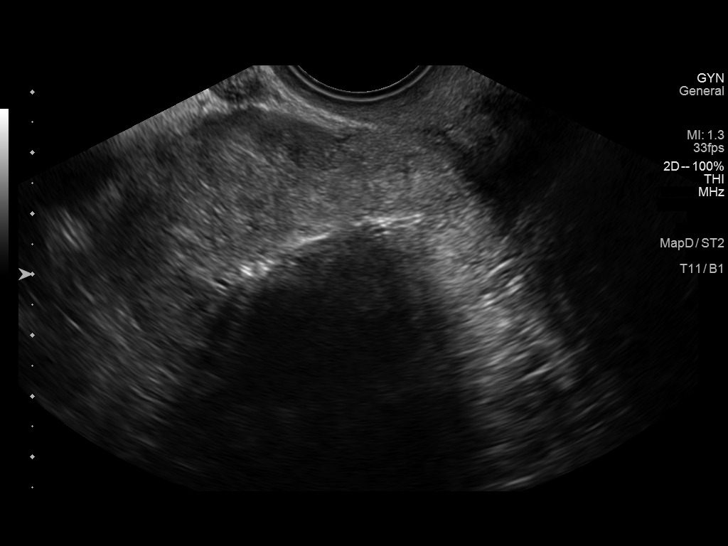
[im 78/117]
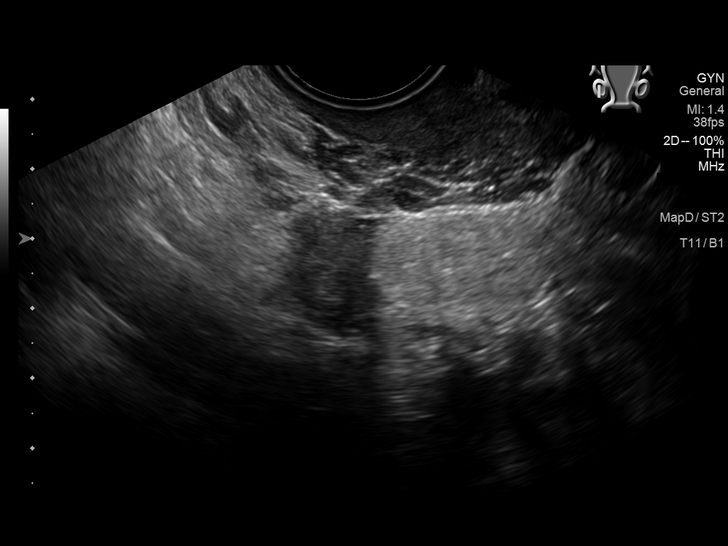
[im 88/117]
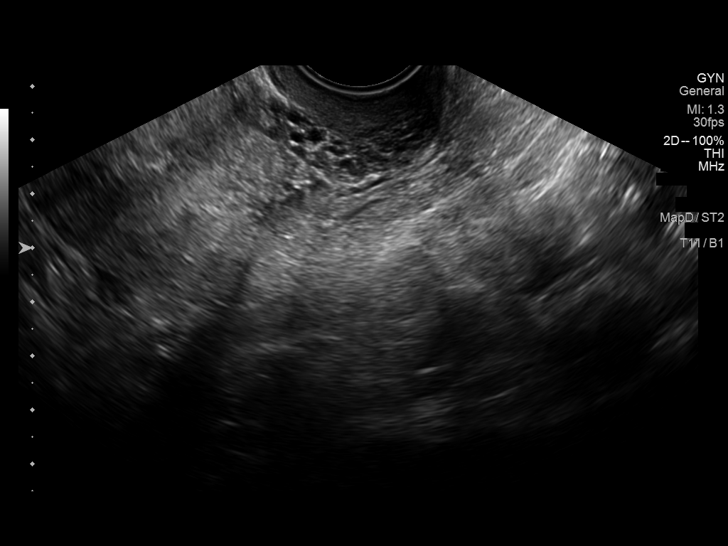
[im 97/117]
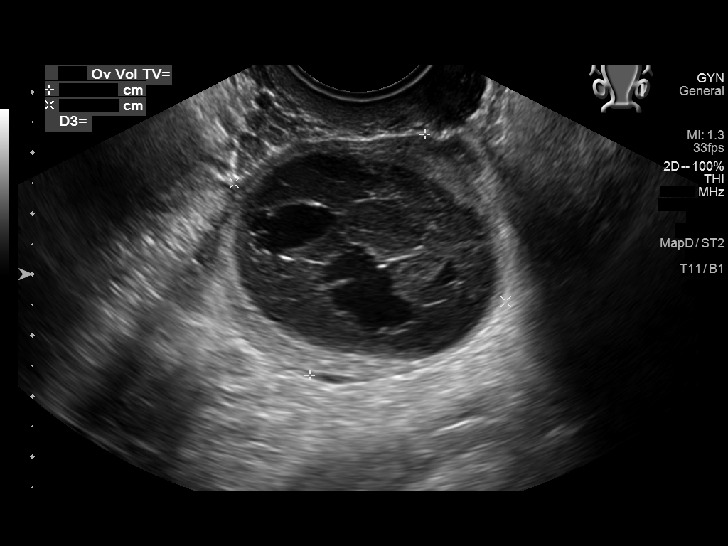
[im 107/117]
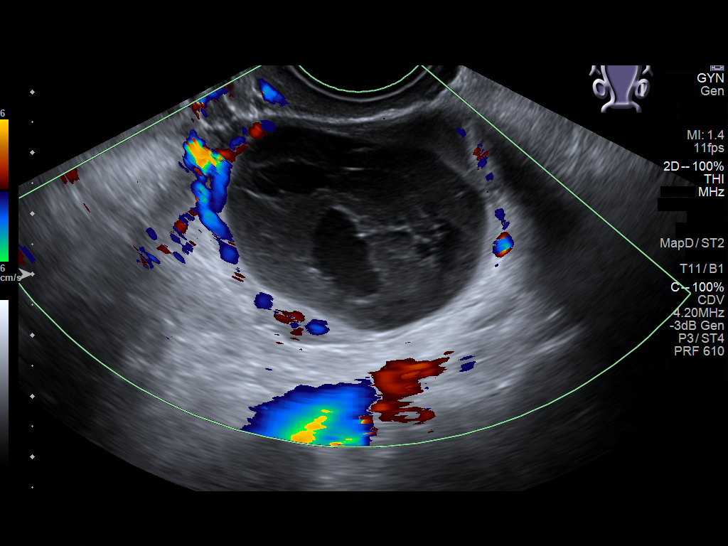
[im 117/117]
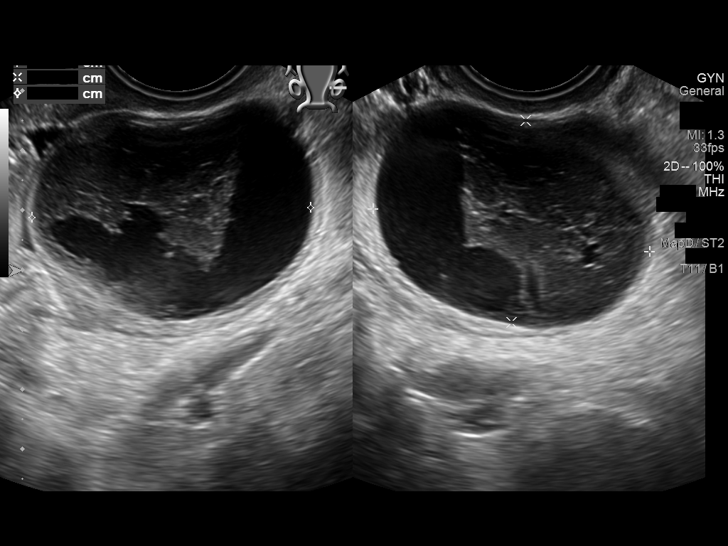

[13 of 25 positions shown; findings below may reference images not displayed]

FINDINGS: Uterus

Measurements: 7.5 x 4.8 x 5.6 cm = volume: 107 mL. Myometrium is
heterogeneous. There is a nabothian cyst in the cervix.
Heterogeneous hypoechoic fibroid measuring 1.2 x 0.8 x 1.1 cm in the
anterior fundus, appears subserosal.

Endometrium

Thickness: 4 mm. No fluid in the endometrial canal. Linear shadowing
injury injured vice intrauterine device appears appropriately
positioned.

Right ovary

Measurements: 2.1 x 1.5 x 2.1 cm = volume: 3.3 mL. Normal
appearance. Normal blood flow. No cyst or adnexal mass.

Left ovary

Measurements: 4.4 x 4.9 x 5.1 cm = volume: 57 mL. There is a complex
cyst in the left ovary measuring 4.7 x 3.4 x 4.7 cm. Jim reticular
pattern with low level echoes and cystic spaces. There is blood flow
to the adjacent ovarian parenchyma.

Other findings

Small volume pelvic free fluid may be physiologic.
IMPRESSION: 1. Complex left ovarian cyst measuring 4.7 cm, favored to be a
hemorrhagic cyst. Blood flow is seen to the adjacent ovarian
parenchyma. Given pelvic pain, recommend follow-up. Short-interval
follow up ultrasound in 6-12 weeks is recommended, preferably during
the week following the patient's normal menses.
2. Normal sonographic appearance of the right ovary.
3. Heterogeneous uterine myometrium with a small 1.2 cm subserosal
fibroid.
4. Thin endometrium with IUD in place.

## 2021-11-08 ENCOUNTER — Ambulatory Visit: Payer: Self-pay

## 2021-11-08 NOTE — Telephone Encounter (Signed)
Pt called back, stating that she is wanting the off brand of Ozempic that starts with an "M". She is unsure of the name of the drug but since the Ozempic gave her headaches and her a1c results she is wanting to see if she qualifies for the other medication. Advised her that I will send this over to Dr. Ancil Boozer nurse and they can give her a call back if it's something that can be prescribed or if they have questions. She states  that will be fine. No other questions noted.   Message from Celene Kras sent at 11/08/2021  4:01 PM EST  Pt called stating that she is needing to have the off brand for ozempic prescribed for her. She states that she is needing to have it due to her most recent a1c lab work. Please advise.     Reason for Disposition  [1] Caller requesting NON-URGENT health information AND [2] PCP's office is the best resource  Answer Assessment - Initial Assessment Questions 1. REASON FOR CALL or QUESTION: "What is your reason for calling today?" or "How can I best help you?" or "What question do you have that I can help answer?"     I want to see if I can take the off brand of Ozempic that starts with an "M"  Protocols used: Information Only Call - No Triage-A-AH

## 2021-11-09 ENCOUNTER — Encounter: Payer: Self-pay | Admitting: Family Medicine

## 2021-11-10 ENCOUNTER — Encounter: Payer: Self-pay | Admitting: Family Medicine

## 2021-11-10 ENCOUNTER — Other Ambulatory Visit: Payer: Self-pay | Admitting: Family Medicine

## 2021-11-23 NOTE — Progress Notes (Signed)
Name: Hannah Terry   MRN: [id number]    DOB: 12/03/1971   Date:11/23/2021       Progress Note  Subjective  Chief Complaint  Insulin Resistance/ Weight  I connected with  Hannah Terry  on 11/23/21 at 11:40 AM EST by a video enabled telemedicine application and verified that I am speaking with the correct person using two identifiers.  I discussed the limitations of evaluation and management by telemedicine and the availability of in person appointments. The patient expressed understanding and agreed to proceed with the virtual visit  Staff also discussed with the patient that there may be a patient responsible charge related to this service. Patient Location: at work  Provider Location: [facility] Additional Individuals present: alone   HPI  Pelvic pain: she had an US done 08/22 that showed a complex left ovarian cyst and is due for repeat study, she had to postpone due to a time conflict   Pre-diabetes/obesity: weight today at work was 214.3 lbs She has been obese , last glucose was up at 144 , also has high triglycerides and low HDL. She states when she turned 42 she gained 20 lbs.She has tried Weight Watchers  She tried Ozempic in the past and had headaches . Highest weight was 225 lbs erlier 22, her goal is to get down to below 200 lbs. She states the weight is causing her to feel tired and SOB with activity. Explained Hannah Terry is the same class as Ozempic, but she is interested in trying     Vitamin D deficiency: she is doing well on vitamin D rx , last level back to normal    B12 deficiency:  last level was down to 220 she was not taking supplements, she received one B12 injection but currently taking supplements, advised to resume otc B12 SL 1000 mcg daily    Dyslipidemia: not on medications, reviewed last labs , still on life style modification.   The 10-year ASCVD risk score (Arnett DK, et al., 2019) is: 1.4%   Values used to calculate the score:     Age: 50 years      Sex: Female     Is Non-Hispanic African American: Yes     Diabetic: No     Tobacco smoker: No     Systolic Blood Pressure: 007 mmHg     Is BP treated: No     HDL Cholesterol: 43 mg/dL     Total Cholesterol: 213 mg/dL   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   Obesity (BMI 30.0-34.9) 01/12/2016   MS (multiple sclerosis) ([place]) 01/12/2016   B12 deficiency 12/23/2015   IUD contraception 04/02/2015    Past Surgical History:  Procedure Laterality Date   COLONOSCOPY WITH PROPOFOL N/A 04/23/2021   Procedure: COLONOSCOPY WITH PROPOFOL;  Surgeon: Lucilla Lame, MD;  Location: [place];  Service: Endoscopy;  Laterality: N/A;   ESOPHAGOGASTRODUODENOSCOPY (EGD) WITH PROPOFOL N/A 04/23/2021   Procedure: ESOPHAGOGASTRODUODENOSCOPY (EGD) WITH PROPOFOL;  Surgeon: Lucilla Lame, MD;  Location: [place];  Service: Endoscopy;  Laterality: N/A;   POLYPECTOMY  04/23/2021  Procedure: POLYPECTOMY;  Surgeon: Lucilla Lame, MD;  Location: [place];  Service: Endoscopy;;    Family History  Problem Relation Age of Onset   Alcohol abuse Mother    Alcohol abuse Father    Drug abuse Father    Heart disease Maternal Grandfather     Social History   Socioeconomic History   Marital status: Divorced    Spouse name: Not on file   Number of children: 4   Years of education: Not on file   Highest education level: High school graduate  Occupational History   Occupation: Control and instrumentation engineer    Employer: [employer]  Tobacco Use   Smoking status: Never    Smokeless tobacco: Former    Types: Snuff, Database administrator Use   Vaping Use: Never used  Substance and Sexual Activity   Alcohol use: No    Alcohol/week: 0.0 standard drinks   Drug use: No   Sexual activity: Not Currently  Other Topics Concern   Not on file  Social History Narrative   ** Merged History Encounter **       She was married for  21 years, has 4 children. Separated since 11/2017, struggling financially since, divorce final 05/20/2019   Social Determinants of Health   Financial Resource Strain: Medium Risk   Difficulty of Paying Living Expenses: Somewhat hard  Food Insecurity: Food Insecurity Present   Worried About Charity fundraiser in the Last Year: Sometimes true   Arboriculturist in the Last Year: Never true  Transportation Needs: No Transportation Needs   Lack of Transportation (Medical): No   Lack of Transportation (Non-Medical): No  Physical Activity: Sufficiently Active   Days of Exercise per Week: 7 days   Minutes of Exercise per Session: 50 min  Stress: No Stress Concern Present   Feeling of Stress : Not at all  Social Connections: Moderately Integrated   Frequency of Communication with Friends and Family: More than three times a week   Frequency of Social Gatherings with Friends and Family: Twice a week   Attends Religious Services: More than 4 times per year   Active Member of [employer] or Organizations: Yes   Attends Archivist Meetings: 1 to 4 times per year   Marital Status: Divorced  Human resources officer Violence: Not At Risk   Fear of Current or Ex-Partner: No   Emotionally Abused: No   Physically Abused: No   Sexually Abused: No     Current Outpatient Medications:    Azelastine-Fluticasone (DYMISTA) 137-50 MCG/ACT SUSP, Place 1 spray into the nose 2 (two) times daily., Disp: 23 g, Rfl: 2   Cholecalciferol (VITAMIN D) 2000 units CAPS, Take 1 capsule (2,000 Units total) by mouth daily., Disp: 30 capsule, Rfl: 0   cyclobenzaprine  (FLEXERIL) 10 MG tablet, Take 1 tablet (10 mg total) by mouth at bedtime as needed for muscle spasms., Disp: 30 tablet, Rfl: 0   hydrOXYzine (ATARAX/VISTARIL) 10 MG tablet, Take 1 tablet (10 mg total) by mouth 3 (three) times daily as needed., Disp: 90 tablet, Rfl: 0   lansoprazole (PREVACID) 30 MG capsule, Take 1 capsule (30 mg total) by mouth 2 (two) times daily before a meal., Disp: 60 capsule, Rfl: 2   levocetirizine (XYZAL) 5 MG tablet, Take 1 tablet (5 mg total) by mouth every evening., Disp: 30 tablet, Rfl: 2   levonorgestrel (MIRENA) 20 MCG/24HR IUD, 1 each by Intrauterine route., Disp: , Rfl:    linaclotide Rolan Lipa)  145 MCG CAPS capsule, Take 1 capsule (145 mcg total) by mouth daily before breakfast., Disp: 16 capsule, Rfl: 0   meloxicam (MOBIC) 15 MG tablet, Take 1 tablet (15 mg total) by mouth daily., Disp: 90 tablet, Rfl: 0  Allergies  Allergen Reactions   Ozempic [Semaglutide]     headache    I personally reviewed active problem list, medication list, allergies, family history, social history, health maintenance with the patient/caregiver today.   ROS  Ten systems reviewed and is negative except as mentioned in HPI   Objective  Virtual encounter, vitals not obtained.  There is no height or weight on file to calculate BMI.  Physical Exam  Awake, alert and oriented   PHQ2/9: Depression screen [facility] 2/9 07/20/2021 07/06/2021 03/16/2021 02/10/2021 11/02/2020  Decreased Interest 0 0 0 1 0  Down, Depressed, Hopeless 0 0 0 0 0  PHQ - 2 Score 0 0 0 1 0  Altered sleeping - - 0 1 -  Tired, decreased energy - - 0 2 -  Change in appetite - - 0 0 -  Feeling bad or failure about yourself  - - 0 0 -  Trouble concentrating - - 0 0 -  Moving slowly or fidgety/restless - - 0 0 -  Suicidal thoughts - - 0 0 -  PHQ-9 Score - - 0 4 -  Difficult doing work/chores - - Not difficult at all Not difficult at all -  Some recent data might be hidden   PHQ-2/9 Result is negative.    Fall  Risk: Fall Risk  07/20/2021 07/06/2021 03/16/2021 02/10/2021 11/02/2020  Falls in the past year? 1 1 0 0 0  Number falls in past yr: 0 0 0 0 0  Injury with Fall? 1 1 0 0 0  Follow up - - - - -     Assessment & Plan  1. Obesity, morbid ([place])  - tirzepatide (MOUNJARO) 2.5 MG/0.5ML Pen; Inject 2.5 mg into the skin once a week.  Dispense: 2 mL; Refill: 0 Discussed possible side effects  2. Fasting hyperglycemia  - tirzepatide (MOUNJARO) 2.5 MG/0.5ML Pen; Inject 2.5 mg into the skin once a week.  Dispense: 2 mL; Refill: 0  3. B12 deficiency  Discussed SL B12   4. Vitamin D deficiency  Continue vitamin D   5. Dyslipidemia   She has started life style modification  I discussed the assessment and treatment plan with the patient. The patient was provided an opportunity to ask questions and all were answered. The patient agreed with the plan and demonstrated an understanding of the instructions.  The patient was advised to call back or seek an in-person evaluation if the symptoms worsen or if the condition fails to improve as anticipated.  I provided 25  minutes of non-face-to-face time during this encounter.

## 2021-11-24 ENCOUNTER — Telehealth (INDEPENDENT_AMBULATORY_CARE_PROVIDER_SITE_OTHER): Payer: 59 | Admitting: Family Medicine

## 2021-11-24 ENCOUNTER — Encounter: Payer: Self-pay | Admitting: Family Medicine

## 2021-11-24 DIAGNOSIS — E559 Vitamin D deficiency, unspecified: Secondary | ICD-10-CM | POA: Diagnosis not present

## 2021-11-24 DIAGNOSIS — R7301 Impaired fasting glucose: Secondary | ICD-10-CM | POA: Diagnosis not present

## 2021-11-24 DIAGNOSIS — E538 Deficiency of other specified B group vitamins: Secondary | ICD-10-CM | POA: Diagnosis not present

## 2021-11-24 DIAGNOSIS — E785 Hyperlipidemia, unspecified: Secondary | ICD-10-CM

## 2021-11-24 MED ORDER — VITAMIN B-12 1000 MCG SL SUBL: 1000.0000 ug | SUBLINGUAL_TABLET | Freq: Every day | SUBLINGUAL | 1 refills | Status: DC

## 2021-11-24 MED ORDER — TIRZEPATIDE 2.5 MG/0.5ML ~~LOC~~ SOAJ: 2.5000 mg | SUBCUTANEOUS | 0 refills | Status: DC

## 2022-03-22 ENCOUNTER — Ambulatory Visit: Payer: Self-pay | Admitting: Internal Medicine

## 2022-03-31 ENCOUNTER — Encounter: Payer: Self-pay | Admitting: Nurse Practitioner

## 2022-03-31 ENCOUNTER — Ambulatory Visit (INDEPENDENT_AMBULATORY_CARE_PROVIDER_SITE_OTHER): Payer: 59 | Admitting: Nurse Practitioner

## 2022-03-31 ENCOUNTER — Other Ambulatory Visit (HOSPITAL_COMMUNITY)
Admission: RE | Admit: 2022-03-31 | Discharge: 2022-03-31 | Disposition: A | Discharge status: Home / Self Care | Payer: 59 | Source: Ambulatory Visit | Attending: Internal Medicine | Admitting: Internal Medicine

## 2022-03-31 VITALS — BP 116/74 | HR 85 | Resp 16 | Ht 65.0 in | Wt 217.0 lb

## 2022-03-31 DIAGNOSIS — N898 Other specified noninflammatory disorders of vagina: Secondary | ICD-10-CM | POA: Insufficient documentation

## 2022-03-31 DIAGNOSIS — L989 Disorder of the skin and subcutaneous tissue, unspecified: Secondary | ICD-10-CM | POA: Diagnosis not present

## 2022-03-31 DIAGNOSIS — T753XXA Motion sickness, initial encounter: Secondary | ICD-10-CM

## 2022-03-31 DIAGNOSIS — M5431 Sciatica, right side: Secondary | ICD-10-CM

## 2022-03-31 DIAGNOSIS — K5904 Chronic idiopathic constipation: Secondary | ICD-10-CM

## 2022-03-31 DIAGNOSIS — R1032 Left lower quadrant pain: Secondary | ICD-10-CM | POA: Diagnosis not present

## 2022-03-31 DIAGNOSIS — E538 Deficiency of other specified B group vitamins: Secondary | ICD-10-CM

## 2022-03-31 DIAGNOSIS — E785 Hyperlipidemia, unspecified: Secondary | ICD-10-CM

## 2022-03-31 DIAGNOSIS — Z131 Encounter for screening for diabetes mellitus: Secondary | ICD-10-CM

## 2022-03-31 MED ORDER — CYANOCOBALAMIN 1000 MCG/ML IJ SOLN
1000.0000 ug | Freq: Once | INTRAMUSCULAR | Status: AC
  Administered 2022-03-31: 1000 ug via INTRAMUSCULAR

## 2022-03-31 MED ORDER — LINACLOTIDE 145 MCG PO CAPS: 145.0000 ug | ORAL_CAPSULE | Freq: Every day | ORAL | 1 refills | Status: DC

## 2022-03-31 MED ORDER — MELOXICAM 15 MG PO TABS: 15.0000 mg | ORAL_TABLET | Freq: Every day | ORAL | 1 refills | Status: DC

## 2022-03-31 MED ORDER — SCOPOLAMINE 1 MG/3DAYS TD PT72: 1.0000 | MEDICATED_PATCH | TRANSDERMAL | 0 refills | Status: DC

## 2022-03-31 NOTE — Progress Notes (Signed)
? ?BP 116/74   Pulse 85   Resp 16   Ht '5\' 5"'$  (1.651 m)   Wt 217 lb (98.4 kg)   SpO2 98%   BMI 36.11 kg/m?   ? ?Subjective:  ? ? Patient ID: Hannah Terry, female    DOB: Oct 06, 1971, 51 y.o.   MRN: [id number] ? ?HPI: ?Hannah Terry is a 51 y.o. female ? ?Chief Complaint  ?Patient presents with  ? Vaginitis  ? ?Vaginal discharge: She says that she has vaginal itching and discharge.  She says sometimes the discharge is green. She denies any odor.  Will get vaginal swab.  Will treat accordingly depending on results.  ? ?Motion sickness:  She says she is going to be traveling and needs medication for motion sickness.  Will send in scopolamine patch. ? ?Skin tag/ mole:  She would like a referral to dermatology to get skin tags removed and to get her body checked for skin cancer.  Will send in referral.  ? ?LLQ abdominal pain/constipation: She says that Dr. Ancil Boozer had ordered a ct of her abdomen in 2020 but no one ever called her about it to get it done.  The initial order was placed due to a complaint of feeling bloated, passing gas, histroy of constipation, bowel movements every 4-5 days but was getting progressively worse.  She had reported to Dr. Ancil Boozer that when she would eat food that she felt like it was not digesting. She had denied any weight loss or blood in stools. She would like to get the ct reordered since the order has expired so she can have it done. She says she is still having pain after eating.  Will reorder ct scan as planned at earlier visit. She did have a colonoscopy this past year.  She also reported that she had been taking linzess to help with the constipation and she would like a refill on that.  ? ?B12 deficiency:  According to Dr. Ruthine Dose notes patient is supposed to get a B12 injection but has not received on yet.  Her B12 was 220 on 09/08/2021.  Will give injection today.  ? ?Dyslipidemia:  Her LDL was 138 on 09/08/21.  She is not currently on medication. Will get labs today.   ?The 10-year ASCVD risk score (Arnett DK, et al., 2019) is: 1.6% ?  Values used to calculate the score: ?    Age: 31 years ?    Sex: Female ?    Is Non-Hispanic African American: Yes ?    Diabetic: No ?    Tobacco smoker: No ?    Systolic Blood Pressure: 924 mmHg ?    Is BP treated: No ?    HDL Cholesterol: 43 mg/dL ?    Total Cholesterol: 213 mg/dL  ? ?Relevant past medical, surgical, family and social history reviewed and updated as indicated. Interim medical history since our last visit reviewed. ?Allergies and medications reviewed and updated. ? ?Review of Systems ? ?Constitutional: Negative for fever or weight change.  ?Respiratory: Negative for cough and shortness of breath.   ?Cardiovascular: Negative for chest pain or palpitations.  ?Gastrointestinal: positive for abdominal pain, constipation ?GU: positive for vaginal discharge ?Musculoskeletal: Negative for gait problem or joint swelling.  ?Skin: Negative for rash.  ?Neurological: Negative for dizziness or headache.  ?No other specific complaints in a complete review of systems (except as listed in HPI above).  ? ?   ?Objective:  ?  ?BP 116/74   Pulse 85  Resp 16   Ht '5\' 5"'$  (1.651 m)   Wt 217 lb (98.4 kg)   SpO2 98%   BMI 36.11 kg/m?   ?Wt Readings from Last 3 Encounters:  ?03/31/22 217 lb (98.4 kg)  ?11/24/21 214 lb 4.8 oz (97.2 kg)  ?07/20/21 213 lb (96.6 kg)  ?  ?Physical Exam ? ?Constitutional: Patient appears well-developed and well-nourished. Obese  No distress.  ?HEENT: head atraumatic, normocephalic, pupils equal and reactive to light,  neck supple ?Cardiovascular: Normal rate, regular rhythm and normal heart sounds.  No murmur heard. No BLE edema. ?Pulmonary/Chest: Effort normal and breath sounds normal. No respiratory distress. ?Abdominal: Soft.  There is generalized tenderness. ?Psychiatric: Patient has a normal mood and affect. behavior is normal. Judgment and thought content normal.  ?Results for orders placed or performed during the  hospital encounter of 09/08/21  ?Lipid panel  ?Result Value Ref Range  ? Cholesterol 213 (H) 0 - 200 mg/dL  ? Triglycerides 158 (H) <150 mg/dL  ? HDL 43 >40 mg/dL  ? Total CHOL/HDL Ratio 5.0 RATIO  ? VLDL 32 0 - 40 mg/dL  ? LDL Cholesterol 138 (H) 0 - 99 mg/dL  ?Comprehensive metabolic panel  ?Result Value Ref Range  ? Sodium 138 135 - 145 mmol/L  ? Potassium 3.7 3.5 - 5.1 mmol/L  ? Chloride 102 98 - 111 mmol/L  ? CO2 26 22 - 32 mmol/L  ? Glucose, Bld 144 (H) 70 - 99 mg/dL  ? BUN 9 6 - 20 mg/dL  ? Creatinine, Ser 0.88 0.44 - 1.00 mg/dL  ? Calcium 9.0 8.9 - 10.3 mg/dL  ? Total Protein 7.7 6.5 - 8.1 g/dL  ? Albumin 4.3 3.5 - 5.0 g/dL  ? AST 21 15 - 41 U/L  ? ALT 20 0 - 44 U/L  ? Alkaline Phosphatase 80 38 - 126 U/L  ? Total Bilirubin 0.7 0.3 - 1.2 mg/dL  ? GFR, Estimated >60 >60 mL/min  ? Anion gap 10 5 - 15  ?Vitamin B12  ?Result Value Ref Range  ? Vitamin B-12 220 180 - 914 pg/mL  ?VITAMIN D 25 Hydroxy (Vit-D Deficiency, Fractures)  ?Result Value Ref Range  ? Vit D, 25-Hydroxy 38.01 30 - 100 ng/mL  ? ?   ?Assessment & Plan:  ? ?1. Vaginal discharge ? ?- Cervicovaginal ancillary only ? ?2. Vaginal itching ? ?- Cervicovaginal ancillary only ? ?3. Motion sickness, initial encounter ? ?- scopolamine (TRANSDERM-SCOP) 1 MG/3DAYS; Place 1 patch (1.5 mg total) onto the skin every 3 (three) days.  Dispense: 20 patch; Refill: 0 ? ?4. Skin lesions ? ?- Ambulatory referral to Dermatology ? ?5. Left lower quadrant abdominal pain ? ?- CT Abdomen Pelvis W Contrast; Future ?- CBC with Differential/Platelet ?- COMPLETE METABOLIC PANEL WITH GFR ? ?6. B12 deficiency ? ?- cyanocobalamin ((VITAMIN B-12)) injection 1,000 mcg ? ?7. Dyslipidemia ? ?- Lipid panel ? ?8. Screening for diabetes mellitus ? ?- Hemoglobin A1c ? ?9. Chronic idiopathic constipation ? ?- linaclotide (LINZESS) 145 MCG CAPS capsule; Take 1 capsule (145 mcg total) by mouth daily before breakfast.  Dispense: 90 capsule; Refill: 1 ? ?10. Sciatica of right side ? ?-  meloxicam (MOBIC) 15 MG tablet; Take 1 tablet (15 mg total) by mouth daily.  Dispense: 90 tablet; Refill: 1  ? ?Follow up plan: ?Return if symptoms worsen or fail to improve. ? ? ? ? ? ?

## 2022-04-01 ENCOUNTER — Other Ambulatory Visit: Payer: Self-pay | Admitting: Nurse Practitioner

## 2022-04-01 DIAGNOSIS — B9689 Other specified bacterial agents as the cause of diseases classified elsewhere: Secondary | ICD-10-CM

## 2022-04-01 DIAGNOSIS — B3731 Acute candidiasis of vulva and vagina: Secondary | ICD-10-CM

## 2022-04-01 LAB — COMPLETE METABOLIC PANEL WITH GFR
AG Ratio: 1.4 (calc) (ref 1.0–2.5)
ALT: 13 U/L (ref 6–29)
AST: 11 U/L (ref 10–35)
Albumin: 4.1 g/dL (ref 3.6–5.1)
Alkaline phosphatase (APISO): 85 U/L (ref 31–125)
BUN: 13 mg/dL (ref 7–25)
CO2: 27 mmol/L (ref 20–32)
Calcium: 9.1 mg/dL (ref 8.6–10.2)
Chloride: 107 mmol/L (ref 98–110)
Creat: 0.92 mg/dL (ref 0.50–0.99)
Globulin: 3 g/dL (calc) (ref 1.9–3.7)
Glucose, Bld: 89 mg/dL (ref 65–99)
Potassium: 4.4 mmol/L (ref 3.5–5.3)
Sodium: 141 mmol/L (ref 135–146)
Total Bilirubin: 0.4 mg/dL (ref 0.2–1.2)
Total Protein: 7.1 g/dL (ref 6.1–8.1)
eGFR: 77 mL/min/{1.73_m2} (ref >=60)

## 2022-04-01 LAB — LIPID PANEL
Cholesterol: 191 mg/dL (ref <200)
HDL: 41 mg/dL — ABNORMAL LOW (ref >=50)
LDL Cholesterol (Calc): 131 mg/dL (calc) — ABNORMAL HIGH
Non-HDL Cholesterol (Calc): 150 mg/dL (calc) — ABNORMAL HIGH (ref <130)
Total CHOL/HDL Ratio: 4.7 (calc) (ref <5.0)
Triglycerides: 93 mg/dL (ref <150)

## 2022-04-01 LAB — CBC WITH DIFFERENTIAL/PLATELET
Absolute Monocytes: 590 cells/uL (ref 200–950)
Basophils Absolute: 62 cells/uL (ref 0–200)
Basophils Relative: 0.7 %
Eosinophils Absolute: 106 cells/uL (ref 15–500)
Eosinophils Relative: 1.2 %
HCT: 41.4 % (ref 35.0–45.0)
Hemoglobin: 13.9 g/dL (ref 11.7–15.5)
Lymphs Abs: 2763 cells/uL (ref 850–3900)
MCH: 30.3 pg (ref 27.0–33.0)
MCHC: 33.6 g/dL (ref 32.0–36.0)
MCV: 90.4 fL (ref 80.0–100.0)
MPV: 10.2 fL (ref 7.5–12.5)
Monocytes Relative: 6.7 %
Neutro Abs: 5280 cells/uL (ref 1500–7800)
Neutrophils Relative %: 60 %
Platelets: 257 10*3/uL (ref 140–400)
RBC: 4.58 10*6/uL (ref 3.80–5.10)
RDW: 12.2 % (ref 11.0–15.0)
Total Lymphocyte: 31.4 %
WBC: 8.8 10*3/uL (ref 3.8–10.8)

## 2022-04-01 LAB — HEMOGLOBIN A1C
Hgb A1c MFr Bld: 5.4 % of total Hgb (ref <5.7)
Mean Plasma Glucose: 108 mg/dL
eAG (mmol/L): 6 mmol/L

## 2022-04-01 LAB — CERVICOVAGINAL ANCILLARY ONLY
Bacterial Vaginitis (gardnerella): POSITIVE — AB
Candida Glabrata: NEGATIVE
Candida Vaginitis: POSITIVE — AB
Comment: NEGATIVE
Comment: NEGATIVE
Comment: NEGATIVE

## 2022-04-01 MED ORDER — FLUCONAZOLE 150 MG PO TABS: 150.0000 mg | ORAL_TABLET | ORAL | 0 refills | Status: DC | PRN

## 2022-04-01 MED ORDER — METRONIDAZOLE 500 MG PO TABS
500.0000 mg | ORAL_TABLET | Freq: Two times a day (BID) | ORAL | 0 refills | Status: AC
Start: 2022-04-01 — End: 2022-04-08

## 2022-04-27 ENCOUNTER — Ambulatory Visit
Admission: RE | Admit: 2022-04-27 | Discharge: 2022-04-27 | Disposition: A | Discharge status: Home / Self Care | Payer: 59 | Source: Ambulatory Visit | Attending: Nurse Practitioner | Admitting: Nurse Practitioner

## 2022-04-27 DIAGNOSIS — R1032 Left lower quadrant pain: Secondary | ICD-10-CM | POA: Diagnosis present

## 2022-04-27 MED ORDER — IOHEXOL 300 MG/ML  SOLN
100.0000 mL | Freq: Once | INTRAMUSCULAR | Status: AC | PRN
  Administered 2022-04-27: 100 mL via INTRAVENOUS

## 2022-04-29 NOTE — Progress Notes (Signed)
Patient notified of labs.   

## 2022-05-19 ENCOUNTER — Telehealth (INDEPENDENT_AMBULATORY_CARE_PROVIDER_SITE_OTHER): Payer: 59 | Admitting: Physician Assistant

## 2022-05-19 ENCOUNTER — Encounter: Payer: Self-pay | Admitting: Physician Assistant

## 2022-05-19 ENCOUNTER — Ambulatory Visit: Payer: Self-pay | Admitting: *Deleted

## 2022-05-19 VITALS — BP 122/69 | Ht 65.0 in | Wt 217.0 lb

## 2022-05-19 DIAGNOSIS — R42 Dizziness and giddiness: Secondary | ICD-10-CM | POA: Diagnosis not present

## 2022-05-19 MED ORDER — MECLIZINE HCL 12.5 MG PO TABS: 12.5000 mg | ORAL_TABLET | Freq: Three times a day (TID) | ORAL | 0 refills | Status: AC | PRN

## 2022-05-19 NOTE — Telephone Encounter (Signed)
  Chief Complaint: dizziness Symptoms: dizziness this am had to hold on to something to walk. Now dizzy but able to walk and work. C/o nausea no vomiting. BP 122/69. Ate and drank some fluids feels a little better. Anxious. Dizzy when changing head position or standing  Frequency: this am  Pertinent Negatives: Patient denies chest pain or difficulty breathing no fever Disposition: '[]'$ ED /'[]'$ Urgent Care (no appt availability in office) / '[x]'$ Appointment(In office/virtual)/ '[]'$  [place] [facility]/ '[]'$ Home Care/ '[]'$ Refused Recommended Disposition /'[]'$ [place] [facility]/ '[]'$  Follow-up with PCP Additional Notes:   Recommended if sx worsen go to ED or call 911 My chart VV requested and appt today .    Reason for Disposition  [1] MODERATE dizziness (e.g., interferes with normal activities) AND [2] has NOT been evaluated by physician for this  (Exception: dizziness caused by heat exposure, sudden standing, or poor fluid intake)  Answer Assessment - Initial Assessment Questions 1. DESCRIPTION: "Describe your dizziness."     Dizzy upon standing  2. LIGHTHEADED: "Do you feel lightheaded?" (e.g., somewhat faint, woozy, weak upon standing)     Woozy, weak upon standing  this am . Feels better now but still dizzy  3. VERTIGO: "Do you feel like either you or the room is spinning or tilting?" (i.e. vertigo)     no 4. SEVERITY: "How bad is it?"  "Do you feel like you are going to faint?" "Can you stand and walk?"   - MILD: Feels slightly dizzy, but walking normally.   - MODERATE: Feels unsteady when walking, but not falling; interferes with normal activities (e.g., school, work).   - SEVERE: Unable to walk without falling, or requires assistance to walk without falling; feels like passing out now.      Mild to moderate but is working . 5. ONSET:  "When did the dizziness begin?"     This am  BP checked for 122/69 6. AGGRAVATING FACTORS: "Does anything make it worse?" (e.g., standing, change in head  position)     Standing change in head position 7. HEART RATE: "Can you tell me your heart rate?" "How many beats in 15 seconds?"  (Note: not all patients can do this)       na 8. CAUSE: "What do you think is causing the dizziness?"     Not sure 9. RECURRENT SYMPTOM: "Have you had dizziness before?" If Yes, ask: "When was the last time?" "What happened that time?"     no 10. OTHER SYMPTOMS: "Do you have any other symptoms?" (e.g., fever, chest pain, vomiting, diarrhea, bleeding)       Nausea no vomiting anxious  11. PREGNANCY: "Is there any chance you are pregnant?" "When was your last menstrual period?"       na  Protocols used: Dizziness - Lightheadedness-A-AH

## 2022-05-19 NOTE — Progress Notes (Deleted)
Virtual Visit via Video Note  I connected with Hannah Terry on 05/19/22 at  1:00 PM EDT by a video enabled telemedicine application and verified that I am speaking with the correct person using two identifiers.  Location: Patient: *** Provider: ***   I discussed the limitations of evaluation and management by telemedicine and the availability of in person appointments. The patient expressed understanding and agreed to proceed.  History of Present Illness:    Observations/Objective:   Assessment and Plan:   Follow Up Instructions:    I discussed the assessment and treatment plan with the patient. The patient was provided an opportunity to ask questions and all were answered. The patient agreed with the plan and demonstrated an understanding of the instructions.   The patient was advised to call back or seek an in-person evaluation if the symptoms worsen or if the condition fails to improve as anticipated.  I provided *** minutes of non-face-to-face time during this encounter.   Kaely Hollan E Laneka Mcgrory, PA-C

## 2022-05-19 NOTE — Patient Instructions (Signed)
It is difficult to fully predict what is causing your dizziness but there are a few things we can do to try to help reduce the severity  Stay well hydrated, eat regular meals at appropriate intervals with snacks to prevent low blood sugar Avoid driving while you are feeling dizzy I have sent in a script for Meclizine 12.5 mg to be taken by mouth as needed up to three times per day If your symptoms are not improving with these measures, please make an apt to see [place] in the office so we can further evaluate and rule out a few other potential causes.  It was nice to meet you and I appreciate the opportunity to be involved in your care

## 2022-05-19 NOTE — Progress Notes (Signed)
Acute Telephone Visit    Patient: Hannah Terry   DOB: Jul 18, 1971   51 y.o. Female  MRN: [id number] Visit Date: 05/19/2022  Today's healthcare provider: Dani Gobble Davarius Ridener, PA-C  Introduced myself to the patient as a Journalist, newspaper and provided education on APPs in clinical practice.    I connected with  Hannah Terry on 05/19/22 by a  telemedicine application and verified that I am speaking with the correct person using two identifiers.   I discussed the limitations of evaluation and management by telemedicine. The patient expressed understanding and agreed to proceed.  Location: Patient: At work Provider: [facility], [place], [place]    I discussed the limitations of evaluation and management by telemedicine and the availability of in person appointments. The patient expressed understanding and agreed to proceed.   Chief Complaint  Patient presents with   Dizziness   Nausea   Subjective    Dizziness Associated symptoms include headaches and nausea. Pertinent negatives include no chills, congestion, diaphoresis, fatigue, fever, numbness, vomiting or weakness.    States she woke up feeling dizzy and lightheaded Just ate lunch and feels a bit better States she is trying to stay hydrated  Doesn't think she has had this before Denies falls since this started  She feels like she is the one spinning Reports she feels a bit shaky     Medications: Outpatient Medications Prior to Visit  Medication Sig   Azelastine-Fluticasone (DYMISTA) 137-50 MCG/ACT SUSP Place 1 spray into the nose 2 (two) times daily.   Cholecalciferol (VITAMIN D) 2000 units CAPS Take 1 capsule (2,000 Units total) by mouth daily.   Cyanocobalamin (VITAMIN B-12) 1000 MCG SUBL Place 1 tablet (1,000 mcg total) under the tongue daily at 12 noon.   cyclobenzaprine (FLEXERIL) 10 MG tablet Take 1 tablet (10 mg total) by mouth at bedtime as needed for muscle spasms.   fluconazole (DIFLUCAN) 150  MG tablet Take 1 tablet (150 mg total) by mouth every 3 (three) days as needed (for vaginal itching/yeast infection sx).   hydrOXYzine (ATARAX/VISTARIL) 10 MG tablet Take 1 tablet (10 mg total) by mouth 3 (three) times daily as needed.   lansoprazole (PREVACID) 30 MG capsule Take 1 capsule (30 mg total) by mouth 2 (two) times daily before a meal.   levocetirizine (XYZAL) 5 MG tablet Take 1 tablet (5 mg total) by mouth every evening.   levonorgestrel (MIRENA) 20 MCG/24HR IUD 1 each by Intrauterine route.   linaclotide (LINZESS) 145 MCG CAPS capsule Take 1 capsule (145 mcg total) by mouth daily before breakfast.   meloxicam (MOBIC) 15 MG tablet Take 1 tablet (15 mg total) by mouth daily.   scopolamine (TRANSDERM-SCOP) 1 MG/3DAYS Place 1 patch (1.5 mg total) onto the skin every 3 (three) days.   No facility-administered medications prior to visit.    Review of Systems  Constitutional:  Negative for chills, diaphoresis, fatigue and fever.  HENT:  Negative for congestion, ear pain and rhinorrhea.   Gastrointestinal:  Positive for nausea. Negative for vomiting.  Neurological:  Positive for dizziness, light-headedness and headaches. Negative for tremors, syncope, weakness and numbness.  Psychiatric/Behavioral:  The patient is nervous/anxious.       Objective    BP 122/69 Comment: per patient  Ht '5\' 5"'$  (1.651 m) Comment: per patient  Wt 217 lb (98.4 kg) Comment: per chart  BMI 36.11 kg/m    Physical Exam Vitals reviewed.  Neurological:  Mental Status: She is alert and oriented to person, place, and time.  Psychiatric:        Attention and Perception: Attention normal.        Speech: Speech normal.    Physical exam is limited due to nature of virtual/ telephone visit. Able to make the above due to interaction during phone call.   No results found for any visits on 05/19/22.  Assessment & Plan     Problem List Items Addressed This Visit   None Visit Diagnoses     Dizziness     -  Primary Acute, new problem since this AM Reports she woke up with dizziness and took her BP - which was within goal range States dizziness seems to be improving slightly with eating lunch, she is able to keep down food and drink Given her symptoms, suspect BPPV but cannot rule out mid ear effusion, dehydration, low blood glucose as culprits.  Will provide meclizine to assist with dizziness IF not improving with food and adequate fluid intake along with meclizine, recommend she come to office to rule out other potential etiologies.  Follow up as needed for persistent or progressing symptoms.     Relevant Medications   meclizine (ANTIVERT) 12.5 MG tablet      I discussed the assessment and treatment plan with the patient. The patient was provided an opportunity to ask questions and all were answered. The patient agreed with the plan and demonstrated an understanding of the instructions.   The patient was advised to call back or seek an in-person evaluation if the symptoms worsen or if the condition fails to improve as anticipated.  I provided 10 minutes of non-face-to-face time during this encounter.  No follow-ups on file.   I, Hannah Limones E Delayla Hoffmaster, PA-C, have reviewed all documentation for this visit. The documentation on 05/19/22 for the exam, diagnosis, procedures, and orders are all accurate and complete.   Talitha Givens, MHS, PA-C [facility]    No follow-ups on file.

## 2022-07-14 NOTE — Progress Notes (Signed)
Name: Hannah Terry   MRN: [id number]    DOB: 1971/07/03   Date:07/15/2022       Progress Note  Subjective  Chief Complaint  Chief Complaint  Patient presents with   Annual Exam    HPI   Pre-diabetes/Morbid obesity: she has a BMI above 35 with co-morbidities such as dyslipidemia , back pain. She states when she turned 42 she gained 20 lbs.She has tried Weight Watchers  She tried Ozempic in the past and had headaches . Highest weight was 225 lbs erlier 22, her goal is to get down to below 200 lbs. She tried GLP-1 agonist in the past and it helped but currently insurance is not covering weight loss medication   MS: she used to see Dr. Manuella Ghazi, she lost to follow up, advised her to go back, states paresthesia resolved, she used to take medication but stopped due to side effects    Vitamin D deficiency: continues supplementation    B12 deficiency:  low levels, she is getting B12 injections monthly    Dyslipidemia: not on medications, reviewed last labs , still on life style modification.    The 10-year ASCVD risk score (Arnett DK, et al., 2019) is: 3.1%   Values used to calculate the score:     Age: 52 years     Sex: Female     Is Non-Hispanic African American: Yes     Diabetic: No     Tobacco smoker: No     Systolic Blood Pressure: 413 mmHg     Is BP treated: No     HDL Cholesterol: 41 mg/dL     Total Cholesterol: 191 mg/dL    Chronic Idiopathic constipation: she was given Linzess but states too costly and would like a sample, discussed miralax prn She can go up to 2 weeks without having a bowel movements , only has bowel movements when she takes medication   History of h. Pylori: she was treated by GI last Spring, repeat urea test negative in July 2022, she continues to feel bloated, she states better when she takes Prevacid BID but is not consistent with medication. She has hiatal hernia.   Skin Tag: she asked to have it removed due to causing irritation and getting larger on  her back. Consent form filled   Intermittent sciatica on right side: she takes flexeril and meloxicam, currently not having radiculitis.   Rotator cuff impingement and takes meloxicam for that also , no trauma, pain is constant but can go up to 10/10, aggravated by internal rotation and abduction  IUD: initially no cycles , but over the past 2 months she has noticed regular light cycles, explained likely okay   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   Obesity (BMI 30.0-34.9) 01/12/2016   MS (multiple sclerosis) ([place]) 01/12/2016   B12 deficiency 12/23/2015   IUD contraception 04/02/2015    Past Surgical History:  Procedure Laterality Date   COLONOSCOPY WITH PROPOFOL N/A 04/23/2021   Procedure: COLONOSCOPY WITH PROPOFOL;  Surgeon: Lucilla Lame, MD;  Location: [place];  Service: Endoscopy;  Laterality: N/A;  ESOPHAGOGASTRODUODENOSCOPY (EGD) WITH PROPOFOL N/A 04/23/2021   Procedure: ESOPHAGOGASTRODUODENOSCOPY (EGD) WITH PROPOFOL;  Surgeon: Lucilla Lame, MD;  Location: [facility];  Service: Endoscopy;  Laterality: N/A;   POLYPECTOMY  04/23/2021   Procedure: POLYPECTOMY;  Surgeon: Lucilla Lame, MD;  Location: [facility];  Service: Endoscopy;;    Family History  Problem Relation Age of Onset   Alcohol abuse Mother    Alcohol abuse Father    Drug abuse Father    Heart disease Maternal Grandfather     Social History   Tobacco Use   Smoking status: Never   Smokeless tobacco: Former    Types: Snuff, Chew  Substance Use Topics   Alcohol use: No     Alcohol/week: 0.0 standard drinks of alcohol     Current Outpatient Medications:    Azelastine-Fluticasone (DYMISTA) 137-50 MCG/ACT SUSP, Place 1 spray into the nose 2 (two) times daily., Disp: 23 g, Rfl: 2   Cholecalciferol (VITAMIN D) 2000 units CAPS, Take 1 capsule (2,000 Units total) by mouth daily., Disp: 30 capsule, Rfl: 0   Cyanocobalamin (VITAMIN B-12) 1000 MCG SUBL, Place 1 tablet (1,000 mcg total) under the tongue daily at 12 noon., Disp: 100 tablet, Rfl: 1   cyclobenzaprine (FLEXERIL) 10 MG tablet, Take 1 tablet (10 mg total) by mouth at bedtime as needed for muscle spasms., Disp: 30 tablet, Rfl: 0   hydrOXYzine (ATARAX/VISTARIL) 10 MG tablet, Take 1 tablet (10 mg total) by mouth 3 (three) times daily as needed., Disp: 90 tablet, Rfl: 0   lansoprazole (PREVACID) 30 MG capsule, Take 1 capsule (30 mg total) by mouth 2 (two) times daily before a meal., Disp: 60 capsule, Rfl: 2   levocetirizine (XYZAL) 5 MG tablet, Take 1 tablet (5 mg total) by mouth every evening., Disp: 30 tablet, Rfl: 2   levonorgestrel (MIRENA) 20 MCG/24HR IUD, 1 each by Intrauterine route., Disp: , Rfl:    linaclotide (LINZESS) 145 MCG CAPS capsule, Take 1 capsule (145 mcg total) by mouth daily before breakfast., Disp: 90 capsule, Rfl: 1   meloxicam (MOBIC) 15 MG tablet, Take 1 tablet (15 mg total) by mouth daily., Disp: 90 tablet, Rfl: 1   scopolamine (TRANSDERM-SCOP) 1 MG/3DAYS, Place 1 patch (1.5 mg total) onto the skin every 3 (three) days., Disp: 20 patch, Rfl: 0   fluconazole (DIFLUCAN) 150 MG tablet, Take 1 tablet (150 mg total) by mouth every 3 (three) days as needed (for vaginal itching/yeast infection sx). (Patient not taking: Reported on 07/15/2022), Disp: 2 tablet, Rfl: 0  Allergies  Allergen Reactions   Ozempic [Semaglutide]     headache    I personally reviewed active problem list, medication list, allergies, family history, social history with the patient/caregiver  today.   ROS  Constitutional: Negative for fever or weight change.  Respiratory: Negative for cough and shortness of breath.   Cardiovascular: Negative for chest pain or palpitations.  Gastrointestinal: Negative for abdominal pain, no bowel changes.  Musculoskeletal: Negative for gait problem or joint swelling.  Skin: Negative for rash.  Neurological: Negative for dizziness or headache.  No other specific complaints in a complete review of systems (except as listed in HPI above).   Objective  Vitals:   07/15/22 1150  BP: 134/80  Pulse: 81  Resp: 16  SpO2: 97%  Weight: 217 lb (98.4 kg)  Height: '5\' 5"'$  (1.651 m)    Body mass index is 36.11 kg/m.  Physical Exam  Constitutional: Patient appears well-developed and well-nourished. Obese  No distress.  HEENT: head atraumatic, normocephalic, pupils equal and reactive to light,, neck supple Cardiovascular: Normal rate, regular rhythm and normal heart sounds.  No murmur heard. No BLE edema. Skin: multiple skin tags, largest one and dark in color on mid back, sending to pathology Muscular skeletal: pain with abduction and internal rotation of right shoulder Pulmonary/Chest: Effort normal and breath sounds normal. No respiratory distress. Abdominal: Soft.  There is no tenderness. Psychiatric: Patient has a normal mood and affect. behavior is normal. Judgment and thought content normal.   PHQ2/9:    07/15/2022   11:50 AM 05/19/2022    1:10 PM 03/31/2022    8:14 AM 11/24/2021    9:26 AM 07/20/2021    7:56 AM  Depression screen PHQ 2/9  Decreased Interest 0 0 0 0 0  Down, Depressed, Hopeless 0 0 0 0 0  PHQ - 2 Score 0 0 0 0 0  Altered sleeping 0 0 0 0   Tired, decreased energy 0 0 0 0   Change in appetite 0 0 0 0   Feeling bad or failure about yourself  0 0 0 0   Trouble concentrating 0 0 0 0   Moving slowly or fidgety/restless 0 0 0 0   Suicidal thoughts 0 0 0 0   PHQ-9 Score 0 0 0 0     phq 9 is negative   Fall Risk:     07/15/2022   11:50 AM 05/19/2022    1:10 PM 03/31/2022    8:14 AM 11/24/2021    9:26 AM 07/20/2021    7:56 AM  Fall Risk   Falls in the past year? 0 0 0 0 1  Number falls in past yr: 0  0 0 0  Injury with Fall? 0  0 0 1  Risk for fall due to : No Fall Risks No Fall Risks No Fall Risks No Fall Risks   Follow up Falls prevention discussed Falls prevention discussed Falls prevention discussed Falls prevention discussed       Functional Status Survey: Is the patient deaf or have difficulty hearing?: No Does the patient have difficulty seeing, even when wearing glasses/contacts?: No Does the patient have difficulty concentrating, remembering, or making decisions?: No Does the patient have difficulty walking or climbing stairs?: No Does the patient have difficulty dressing or bathing?: No Does the patient have difficulty doing errands alone such as visiting a doctor's office or shopping?: No    Assessment & Plan   1. B12 deficiency  - cyanocobalamin (VITAMIN B12) injection 1,000 mcg  2. Skin tag  - lidocaine (PF) (XYLOCAINE) 1 % injection 2 mL   Consent signed: YES  Procedure: Skin Mass Removal Location: back ( largest one ) sent to pathology, other three small ones under left breast, left flank and right axillary area   Equipment used: derma-blade, high temperature cautery, sterile scalpel, tweezers, curved scissors Anesthesia: 1% Lidocaine w/o Epinephrine  Cleaned and prepped: Betadine  After consent signed, are of skin prepped with betadine. Lidocaine w/o epinephrine injected into skin underneath skin mass. After properly numbed sterile equipment used to remove tag.  Specimen sent for pathology analysis. Instructed on proper care to allow for proper healing. F/U for nursing visit if needed.   3. Chronic idiopathic constipation  Needs to see if linzess is covered and if too expensive change to miralax daily   4. Impingement of right shoulder  - Ambulatory referral to  Physical Therapy  5. Dyslipidemia   6. Obesity, morbid (  [place])  Discussed with the patient the risk posed by an increased BMI. Discussed importance of portion control, calorie counting and at least 150 minutes of physical activity weekly. Avoid sweet beverages and drink more water. Eat at least 6 servings of fruit and vegetables daily     8. MS (multiple sclerosis) ([place])  Used to see Dr. Manuella Ghazi but lost to follow up, advised her to go back

## 2022-07-14 NOTE — Patient Instructions (Signed)
Preventive Care 49-49 Years Old, Female Preventive care refers to lifestyle choices and visits with your health care provider that can promote health and wellness. Preventive care visits are also called wellness exams. What can I expect for my preventive care visit? Counseling Your health care provider may ask you questions about your: Medical history, including: Past medical problems. Family medical history. Pregnancy history. Current health, including: Menstrual cycle. Method of birth control. Emotional well-being. Home life and relationship well-being. Sexual activity and sexual health. Lifestyle, including: Alcohol, nicotine or tobacco, and drug use. Access to firearms. Diet, exercise, and sleep habits. Work and work Statistician. Sunscreen use. Safety issues such as seatbelt and bike helmet use. Physical exam Your health care provider will check your: Height and weight. These may be used to calculate your BMI (body mass index). BMI is a measurement that tells if you are at a healthy weight. Waist circumference. This measures the distance around your waistline. This measurement also tells if you are at a healthy weight and may help predict your risk of certain diseases, such as type 2 diabetes and high blood pressure. Heart rate and blood pressure. Body temperature. Skin for abnormal spots. What immunizations do I need?  Vaccines are usually given at various ages, according to a schedule. Your health care provider will recommend vaccines for you based on your age, medical history, and lifestyle or other factors, such as travel or where you work. What tests do I need? Screening Your health care provider may recommend screening tests for certain conditions. This may include: Lipid and cholesterol levels. Diabetes screening. This is done by checking your blood sugar (glucose) after you have not eaten for a while (fasting). Pelvic exam and Pap test. Hepatitis B test. Hepatitis C  test. HIV (human immunodeficiency virus) test. STI (sexually transmitted infection) testing, if you are at risk. Lung cancer screening. Colorectal cancer screening. Mammogram. Talk with your health care provider about when you should start having regular mammograms. This may depend on whether you have a family history of breast cancer. BRCA-related cancer screening. This may be done if you have a family history of breast, ovarian, tubal, or peritoneal cancers. Bone density scan. This is done to screen for osteoporosis. Talk with your health care provider about your test results, treatment options, and if necessary, the need for more tests. Follow these instructions at home: Eating and drinking  Eat a diet that includes fresh fruits and vegetables, whole grains, lean protein, and low-fat dairy products. Take vitamin and mineral supplements as recommended by your health care provider. Do not drink alcohol if: Your health care provider tells you not to drink. You are pregnant, may be pregnant, or are planning to become pregnant. If you drink alcohol: Limit how much you have to 0-1 drink a day. Know how much alcohol is in your drink. In the [place] one drink equals one 12 oz bottle of beer (355 mL), one 5 oz glass of wine (148 mL), or one 1 oz glass of hard liquor (44 mL). Lifestyle Brush your teeth every morning and night with fluoride toothpaste. Floss one time each day. Exercise for at least 30 minutes 5 or more days each week. Do not use any products that contain nicotine or tobacco. These products include cigarettes, chewing tobacco, and vaping devices, such as e-cigarettes. If you need help quitting, ask your health care provider. Do not use drugs. If you are sexually active, practice safe sex. Use a condom or other form of protection to  prevent STIs. If you do not wish to become pregnant, use a form of birth control. If you plan to become pregnant, see your health care provider for a  prepregnancy visit. Take aspirin only as told by your health care provider. Make sure that you understand how much to take and what form to take. Work with your health care provider to find out whether it is safe and beneficial for you to take aspirin daily. Find healthy ways to manage stress, such as: Meditation, yoga, or listening to music. Journaling. Talking to a trusted person. Spending time with friends and family. Minimize exposure to UV radiation to reduce your risk of skin cancer. Safety Always wear your seat belt while driving or riding in a vehicle. Do not drive: If you have been drinking alcohol. Do not ride with someone who has been drinking. When you are tired or distracted. While texting. If you have been using any mind-altering substances or drugs. Wear a helmet and other protective equipment during sports activities. If you have firearms in your house, make sure you follow all gun safety procedures. Seek help if you have been physically or sexually abused. What's next? Visit your health care provider once a year for an annual wellness visit. Ask your health care provider how often you should have your eyes and teeth checked. Stay up to date on all vaccines. This information is not intended to replace advice given to you by your health care provider. Make sure you discuss any questions you have with your health care provider. Document Revised: 06/02/2021 Document Reviewed: 06/02/2021 Elsevier Patient Education  [place].

## 2022-07-15 ENCOUNTER — Other Ambulatory Visit (HOSPITAL_COMMUNITY)
Admission: RE | Admit: 2022-07-15 | Discharge: 2022-07-15 | Disposition: A | Discharge status: Home / Self Care | Payer: 59 | Source: Ambulatory Visit | Attending: Family Medicine | Admitting: Family Medicine

## 2022-07-15 ENCOUNTER — Ambulatory Visit (INDEPENDENT_AMBULATORY_CARE_PROVIDER_SITE_OTHER): Payer: 59 | Admitting: Family Medicine

## 2022-07-15 ENCOUNTER — Encounter: Payer: Self-pay | Admitting: Family Medicine

## 2022-07-15 VITALS — BP 134/80 | HR 81 | Resp 16 | Ht 65.0 in | Wt 217.0 lb

## 2022-07-15 DIAGNOSIS — E538 Deficiency of other specified B group vitamins: Secondary | ICD-10-CM | POA: Diagnosis not present

## 2022-07-15 DIAGNOSIS — G35D Multiple sclerosis, unspecified: Secondary | ICD-10-CM

## 2022-07-15 DIAGNOSIS — E785 Hyperlipidemia, unspecified: Secondary | ICD-10-CM

## 2022-07-15 DIAGNOSIS — G35 Multiple sclerosis: Secondary | ICD-10-CM | POA: Diagnosis not present

## 2022-07-15 DIAGNOSIS — K5904 Chronic idiopathic constipation: Secondary | ICD-10-CM

## 2022-07-15 DIAGNOSIS — M25811 Other specified joint disorders, right shoulder: Secondary | ICD-10-CM

## 2022-07-15 DIAGNOSIS — L918 Other hypertrophic disorders of the skin: Secondary | ICD-10-CM | POA: Insufficient documentation

## 2022-07-15 DIAGNOSIS — Z0000 Encounter for general adult medical examination without abnormal findings: Secondary | ICD-10-CM | POA: Diagnosis not present

## 2022-07-15 DIAGNOSIS — E559 Vitamin D deficiency, unspecified: Secondary | ICD-10-CM

## 2022-07-15 MED ORDER — LIDOCAINE HCL (PF) 1 % IJ SOLN
2.0000 mL | Freq: Once | INTRAMUSCULAR | Status: AC
  Administered 2022-07-15: 2 mL via INTRADERMAL

## 2022-07-15 MED ORDER — CYANOCOBALAMIN 1000 MCG/ML IJ SOLN
1000.0000 ug | Freq: Once | INTRAMUSCULAR | Status: AC
  Administered 2022-07-15: 1000 ug via INTRAMUSCULAR

## 2022-07-20 ENCOUNTER — Encounter: Payer: Self-pay | Admitting: Family Medicine

## 2022-07-20 LAB — SURGICAL PATHOLOGY

## 2022-07-21 ENCOUNTER — Other Ambulatory Visit: Payer: Self-pay | Admitting: Family Medicine

## 2022-07-21 MED ORDER — POLYETHYLENE GLYCOL 3350 17 GM/SCOOP PO POWD: 17.0000 g | Freq: Every day | ORAL | 1 refills | Status: AC

## 2022-07-27 IMAGING — CT CT ABD-PELV W/ CM
2 of 5 series · 16 of 46 positions shown, 18 images · IV contrast (agent unspecified)
Comparison: None Available.

CLINICAL DATA: Left lower quadrant abdomen pain.

EXAM:
CT ABDOMEN AND PELVIS WITH CONTRAST
TECHNIQUE: Multidetector CT imaging of the abdomen and pelvis was performed
using the standard protocol following bolus administration of
intravenous contrast.

[Series 2: abd pelvis 5.00 · axial · 0.89mm/px · z∈[-1664,-1219]mm · 13 of 101 slices shown, 15 images]
[im 6/101  soft-tissue]
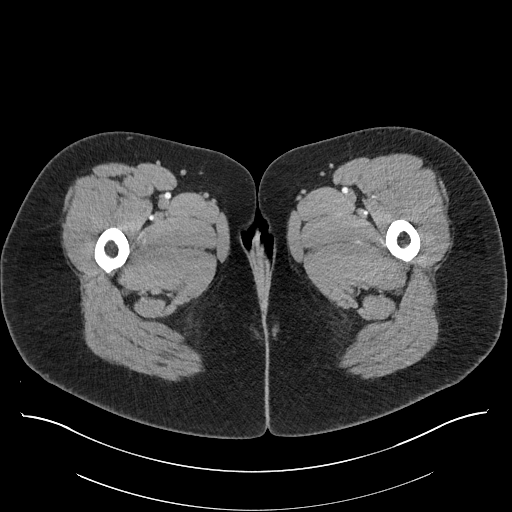
[im 6/101  bone]
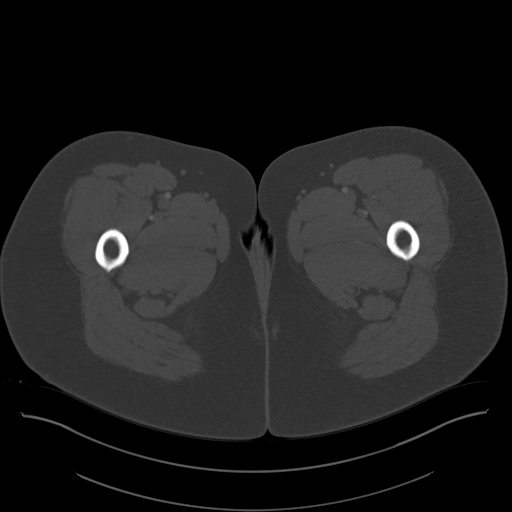
[im 12/101  soft-tissue]
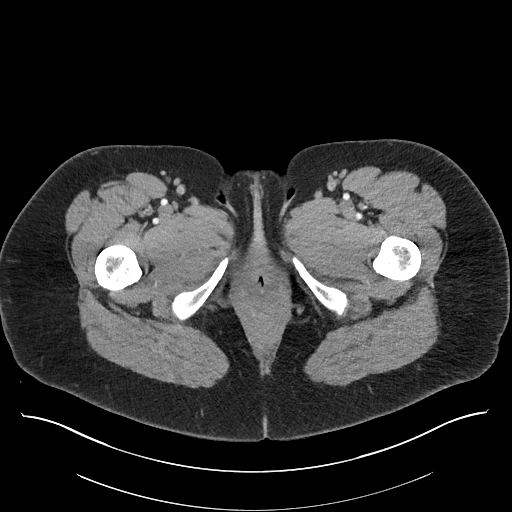
[im 23/101  soft-tissue]
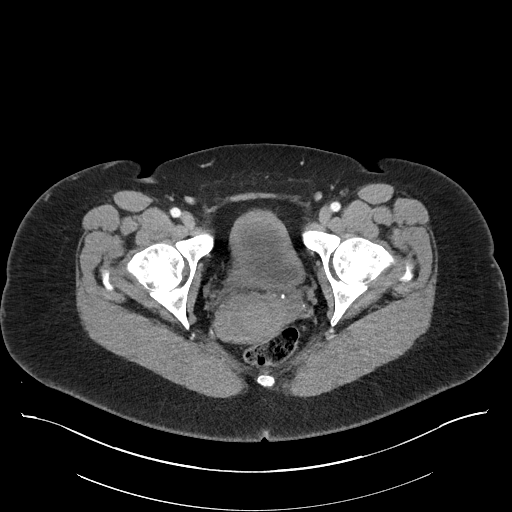
[im 28/101  soft-tissue]
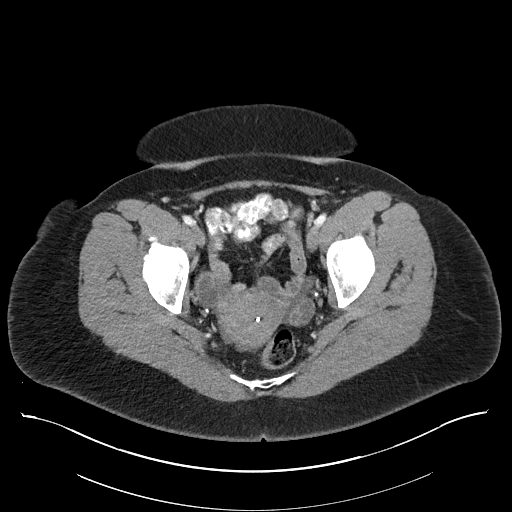
[im 34/101  soft-tissue]
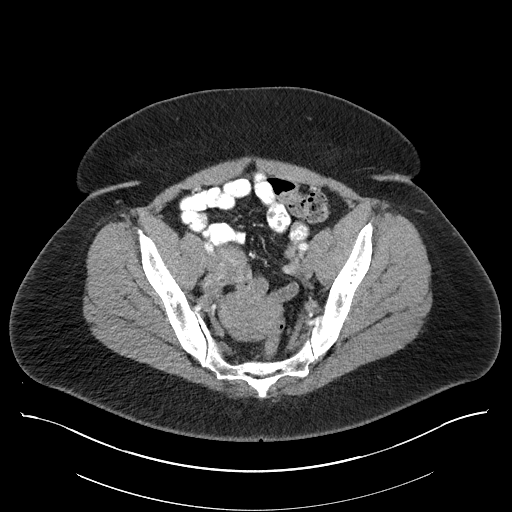
[im 45/101  soft-tissue]
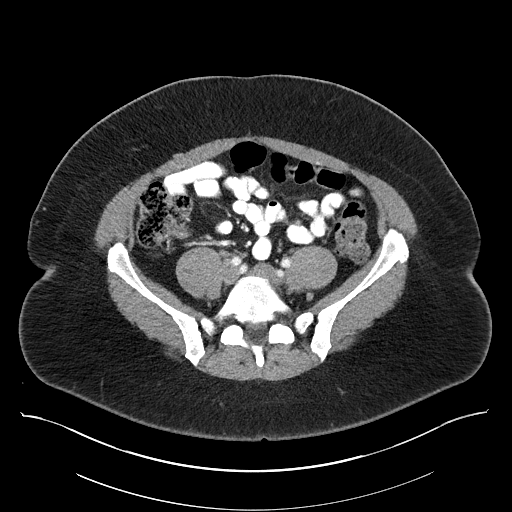
[im 51/101  soft-tissue]
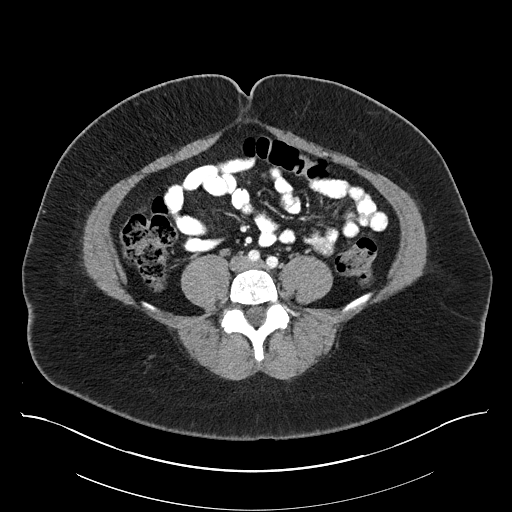
[im 56/101  soft-tissue]
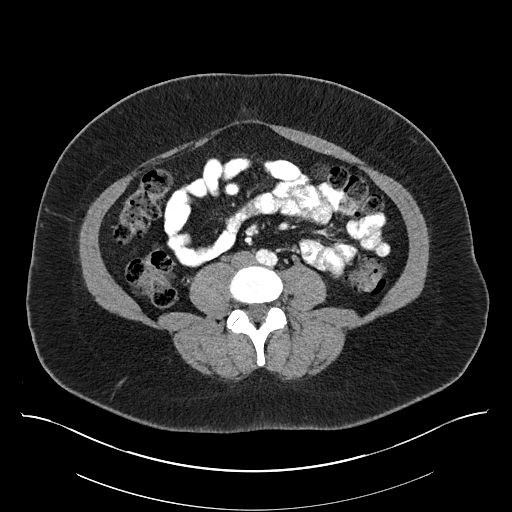
[im 67/101  soft-tissue]
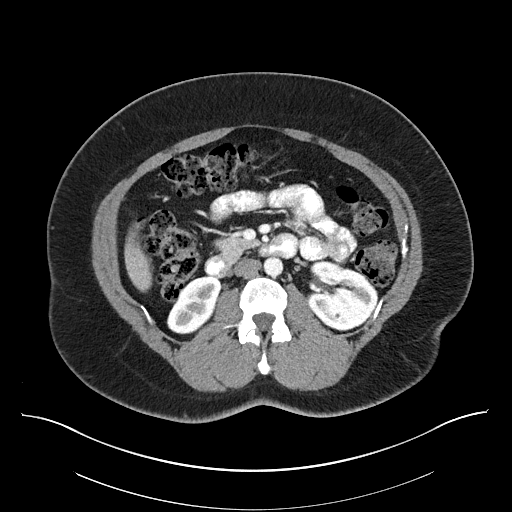
[im 67/101  bone]
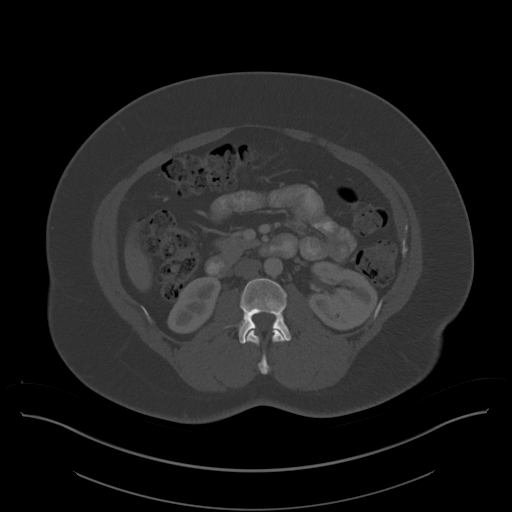
[im 73/101  soft-tissue]
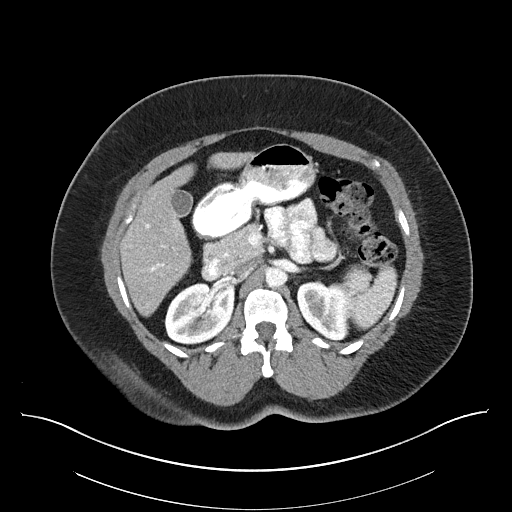
[im 78/101  soft-tissue]
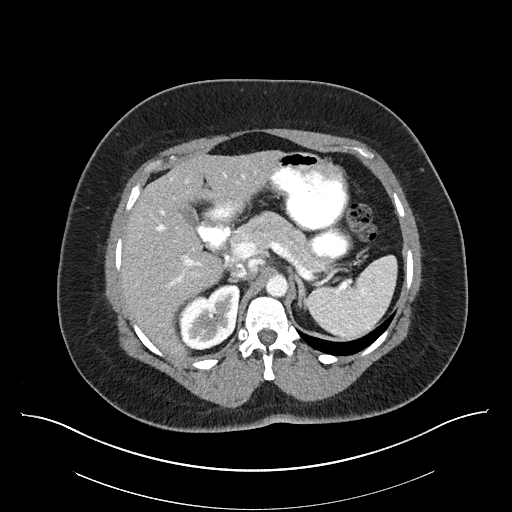
[im 89/101  soft-tissue]
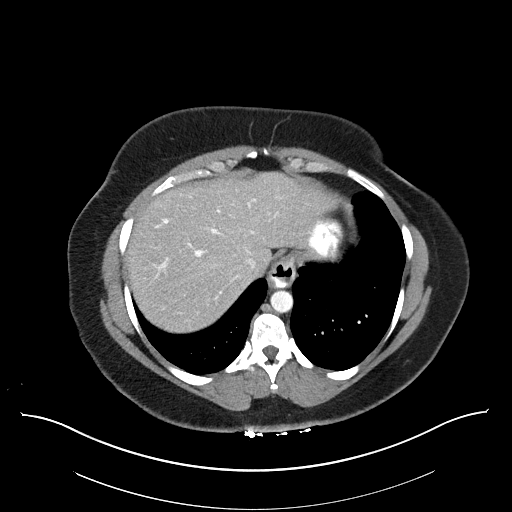
[im 95/101  soft-tissue]
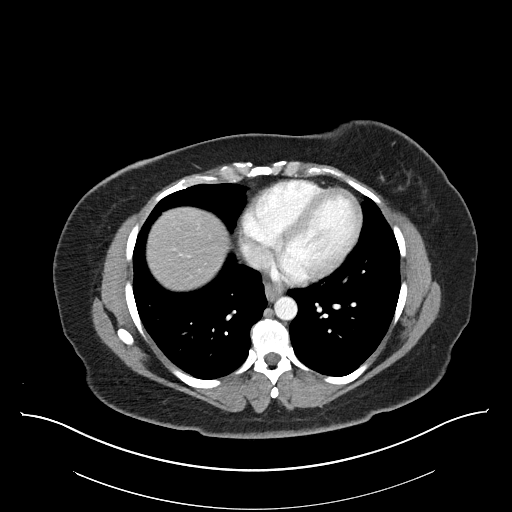

[Series 4: coronals abd pelvis 2.00 cor · coronal · 0.89mm/px · 3 of 227 slices shown]
[im 76/227  soft-tissue]
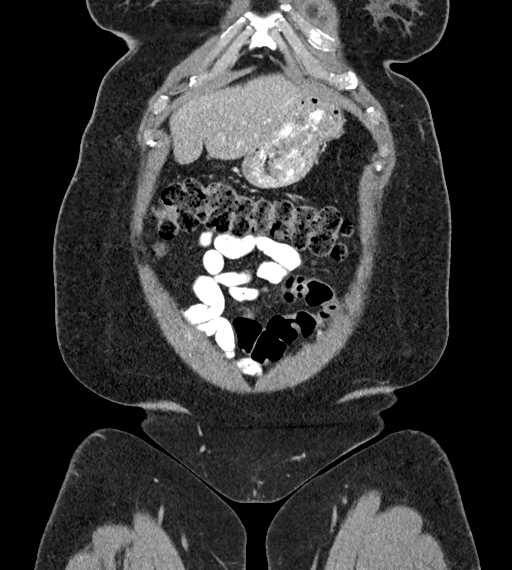
[im 101/227  soft-tissue]
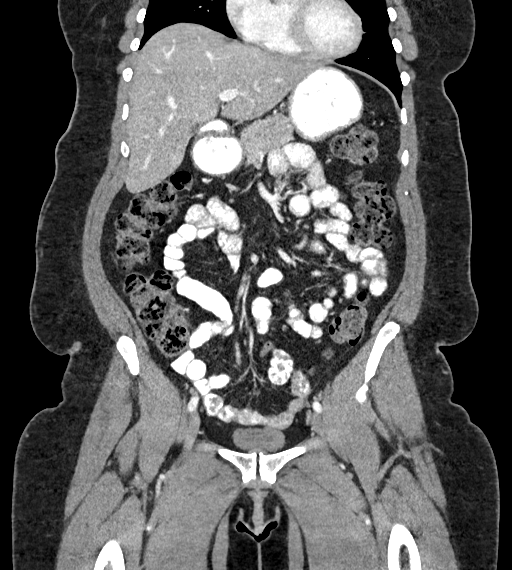
[im 126/227  soft-tissue]
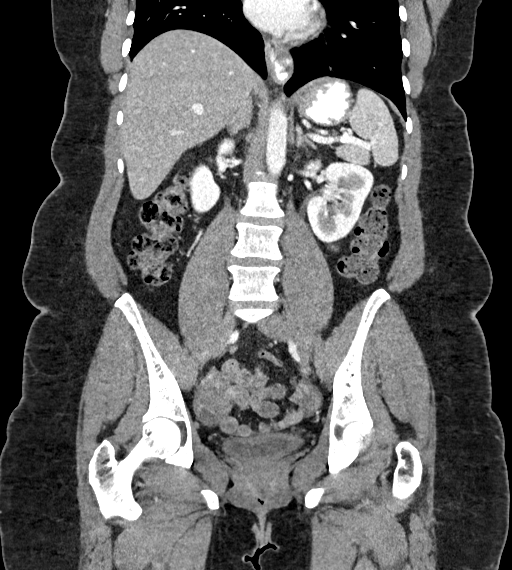

[16 of 46 positions shown; findings below may reference images not displayed]

RADIATION DOSE REDUCTION: This exam was performed according to the
departmental dose-optimization program which includes automated
exposure control, adjustment of the mA and/or kV according to
patient size and/or use of iterative reconstruction technique.

CONTRAST:  100mL OMNIPAQUE IOHEXOL 300 MG/ML  SOLN
FINDINGS: Lower chest: Mild dependent atelectasis of posterior lung base is
noted. The lungs are otherwise clear. The heart size normal.

Hepatobiliary: No focal liver abnormality is seen. No gallstones,
gallbladder wall thickening, or biliary dilatation.

Pancreas: Unremarkable. No pancreatic ductal dilatation or
surrounding inflammatory changes.

Spleen: Normal in size without focal abnormality.

Adrenals/Urinary Tract: Adrenal glands are unremarkable. Kidneys are
normal, without renal calculi, focal lesion, or hydronephrosis. The
bladder is partially decompressed diffuse thickened wall which is
probably secondary to decompression.

Stomach/Bowel: Small hiatal hernia is noted. Stomach is otherwise
within normal limits. Appendix appears normal. No evidence of bowel
wall thickening, distention, or inflammatory changes.

Vascular/Lymphatic: No significant vascular findings are present. No
enlarged abdominal or pelvic lymph nodes.

Reproductive: Uterus and bilateral adnexa are unremarkable. IUD is
noted in the uterus.

Other: Small umbilical herniation of mesenteric fat is noted.

Musculoskeletal: Degenerative joint changes with endplate sclerosis
and osteophyte formation identified at L5-S1.
IMPRESSION: 1. No acute abnormality identified in the abdomen and pelvis.
2. Small hiatal hernia.
3. Small umbilical herniation of mesenteric fat.

## 2022-08-24 ENCOUNTER — Ambulatory Visit: Payer: 59 | Attending: Family Medicine

## 2022-08-24 DIAGNOSIS — M25511 Pain in right shoulder: Secondary | ICD-10-CM | POA: Diagnosis not present

## 2022-08-24 DIAGNOSIS — M6281 Muscle weakness (generalized): Secondary | ICD-10-CM | POA: Diagnosis not present

## 2022-08-24 DIAGNOSIS — G8929 Other chronic pain: Secondary | ICD-10-CM

## 2022-08-24 DIAGNOSIS — M542 Cervicalgia: Secondary | ICD-10-CM | POA: Diagnosis not present

## 2022-08-24 DIAGNOSIS — M25811 Other specified joint disorders, right shoulder: Secondary | ICD-10-CM | POA: Diagnosis not present

## 2022-08-24 NOTE — Therapy (Signed)
[facility] [address], [place], [address] Phone: [phone]   Fax:  [phone]  Physical Therapy Evaluation  Patient Details  Name: Hannah Terry MRN: [id number] Date of Birth: 1971/07/06 Referring Provider (PT): Steele Sizer, MD   Encounter Date: 08/24/2022   PT End of Session - 08/24/22 1721     Visit Number 1    Number of Visits 17    Date for PT Re-Evaluation 10/20/22    Authorization Type 1    Authorization Time Period of 10 progress report    PT Start Time 1722    PT Stop Time 1816    PT Time Calculation (min) 54 min    Activity Tolerance Patient tolerated treatment well;Patient limited by pain    Behavior During Therapy [facility] for tasks assessed/performed             Past Medical History:  Diagnosis Date   Acanthosis nigricans    B12 deficiency    Chronic vaginitis    Multiple sclerosis ([place])    Lesion on C Spine   Obesity    Vitamin D deficiency     Past Surgical History:  Procedure Laterality Date   COLONOSCOPY WITH PROPOFOL N/A 04/23/2021   Procedure: COLONOSCOPY WITH PROPOFOL;  Surgeon: Lucilla Lame, MD;  Location: [place];  Service: Endoscopy;  Laterality: N/A;   ESOPHAGOGASTRODUODENOSCOPY (EGD) WITH PROPOFOL N/A 04/23/2021   Procedure: ESOPHAGOGASTRODUODENOSCOPY (EGD) WITH PROPOFOL;  Surgeon: Lucilla Lame, MD;  Location: [place];  Service: Endoscopy;  Laterality: N/A;   POLYPECTOMY  04/23/2021   Procedure: POLYPECTOMY;  Surgeon: Lucilla Lame, MD;  Location: [place] [facility];  Service: Endoscopy;;    There were no vitals filed for this visit.    Subjective Assessment - 08/24/22 1725     Subjective R shoulder: 0/10 currently at rest, 2/10 with R shoulder flexion; 9/10 at most for the past 3 months (such as when trying to reach back).    Pertinent History R shoulder pain. Pain located anterior shoulder. Gradual onset for the past 6 months. R hand  dominant. Pain is about the same since onset. 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 Also has R neck stiffness. Stopped taking Tecfedera years ago for MS secondary to side effects of facial flushing.    Patient Stated Goals Put her bra and shirt on as well as putting her hand in her R pocket without pain.    Currently in Pain? Yes    Pain Score 2     Pain Location Shoulder    Pain Orientation Right;Anterior    Pain Descriptors / Indicators Dull   Deep dull pain   Pain Type Chronic pain    Pain Onset More than a month ago    Pain Frequency Occasional    Aggravating Factors  Rotating her shoulder donning and doffing shirts and bras, R shoulder abduction R shoulder extension, reaching behind her back.    Pain Relieving Factors Resting position                [facility] PT Assessment - 08/24/22 1724       Assessment   Medical Diagnosis M25.811 (ICD-10-CM) - Impingement of right shoulder    Referring Provider (PT) Steele Sizer, MD    Onset Date/Surgical Date 07/15/22    Hand Dominance Right    Prior Therapy No known PT for current condition      Precautions   Precaution  Comments Multiple sclerosis      Restrictions   Other Position/Activity Restrictions No known restrictions      Balance Screen   Has the patient fallen in the past 6 months No    Has the patient had a decrease in activity level because of a fear of falling?  No    Is the patient reluctant to leave their home because of a fear of falling?  No      Prior Function   Vocation Full time Architectural technologist at [employer]     Observation/Other Assessments   Focus on Therapeutic Outcomes (FOTO)  R shoulder FOTO 58      Posture/Postural Control   Posture Comments forward neck, B scapular protraction, movement crease around C5/C6 area, increased lordosis around L4 area      AROM   Overall AROM Comments L shoulder AROM: WFL all planes.    Right Shoulder Flexion 145 Degrees   162 AAROM,  stiff end feel   Right Shoulder ABduction 135 Degrees   with pain, 173 degrees AAROM   Right Shoulder Internal Rotation --   Functional IR: R thumb to coccyx   Right Shoulder External Rotation --   Functional ER: R 3rd digit to L scapular spine.   Cervical Flexion WFL    Cervical Extension WFL with pain around C5/C6 area    Cervical - Right Side Bend WFL    Cervical - Left Side [facility] with R anterior shoulder pull/pain    Cervical - Right Rotation WFL    Cervical - Left Rotation WFL with R arm numbness (around radial nerve)      Strength   Overall Strength Comments Manually resisted scapular retraction targeting lower trap muscles in sitting: 4-/5 R, 3+/5 L    Right Shoulder Flexion 4/5    Right Shoulder ABduction 3-/5    Right Shoulder Internal Rotation 4/5    Right Shoulder External Rotation 4+/5    Left Shoulder Flexion 4+/5    Left Shoulder ABduction 4+/5    Left Shoulder Internal Rotation 4/5    Left Shoulder External Rotation 4+/5    Right Elbow Flexion 4/5    Right Elbow Extension 4+/5    Left Elbow Flexion 4/5    Left Elbow Extension 4+/5    Right Wrist Extension 4+/5    Left Wrist Extension 4/5      Palpation   Palpation comment TTP R acromion process, as well as subacromial bura area.R upper trap muscle tension      Special Tests   Other special tests R shoulder: (-) empty can, (-) Hawkins-Kennedy when performed by PT, Pain when patient raises her arm herself (R anterior shoulder pain).   R shoulder painful arc                       Objective measurements completed on examination: See above findings.   No latex allergies         Manual therapy  Seated STM R upper trap muscle to decrease tension   Decreased R shoulder pain with abduction   Therapeutic exercise  Standing B scapular retraction 10x5 seconds   Pt was recommended to use ice to her R shoulder 15 minutes at a time with cloth barrier, 4-5 times daily. Pt verbalized  understanding.     Improved exercise technique, movement at target joints, use of target muscles after mod verbal, visual, tactile cues.     Response  to treatment Pt tolerated session well without aggravation of symptoms   Clinical impression Pt is a 51 year old female who came to physical therapy secondary to R shoulder pain of insidious onset about 6 months ago. She also presents with altered posture, reproduction of R anterior shoulder pain with shoulder abduction, extension, functional IR, and at times flexion; painful arc, TTP R anterior shoulder around the acromion process and bursal area, reproduction of symptoms with L cervical side bend suggesting neck involvement; scapular weakness, and difficulty performing tasks such as donning and doffing clothes as well as reaching out to the side due to pain. Pt will benefit from skilled physical therapy services to address the aforementioned deficits.            Home exercise program  Access Code: [email or web address] URL: [email or web address] Date: 08/24/2022 Prepared by: Joneen Boers  Exercises - Seated Scapular Retraction  - 1 x daily - 7 x weekly - 3 sets - 10 reps - 5 seconds hold              PT Education - 08/24/22 1837     Education Details Ther-ex, plan of care, HEP    Person(s) Educated Patient    Methods Explanation;Demonstration;Tactile cues;Verbal cues;Handout    Comprehension Returned demonstration;Verbalized understanding              PT Short Term Goals - 08/24/22 1827       PT SHORT TERM GOAL #1   Title Pt will be independent with her initial HEP to decrease pain, improve strength, function, and ability to reach and don/doff shirts more comfortably.    Baseline Pt has started her initial HEP (08/24/2022)    Time 3    Period Weeks    Status New    Target Date 09/15/22               PT Long Term Goals - 08/24/22 1831       PT LONG TERM GOAL #1   Title Pt will have a  decrease in R shoulder pain to 4/10 or less at worst to promote ability to don/doff shirts, as well as reach more comfortably.    Baseline 9/10 R shoulder pain at worst for the past 3 months (08/24/2022)    Time 8    Period Weeks    Status New    Target Date 10/20/22      PT LONG TERM GOAL #2   Title Pt will improve her shoulder FOTO score by at least 10 points as a demonstration of improved function.    Baseline R shoulder FOTO 58 (08/24/2022)    Time 8    Period Weeks    Status New    Target Date 10/20/22      PT LONG TERM GOAL #3   Title Pt will improve her R shoulder functional IR AROM to R thumb to T7 spinous process to promote ability to don and doff clothes more comfortably.    Baseline Functional R shoulder AROM: R thumb to coccyx (08/24/2022)    Time 8    Period Weeks    Status New    Target Date 10/20/22      PT LONG TERM GOAL #4   Title Pt will improve R shoulder abduction AROM to 155 degrees or more to promote ability to reach more comfortably.    Baseline R shoulder abduction AROM 135 degrees with pain (08/24/2022)    Time 8    Period Weeks  Status New    Target Date 10/20/22                    Plan - 08/24/22 1839     Clinical Impression Statement Pt is a 51 year old female who came to physical therapy secondary to R shoulder pain of insidious onset about 6 months ago. She also presents with altered posture, reproduction of R anterior shoulder pain with shoulder abduction, extension, functional IR, and at times flexion; painful arc, TTP R anterior shoulder around the acromion process and bursal area, reproduction of symptoms with L cervical side bend suggesting neck involvement; scapular weakness, and difficulty performing tasks such as donning and doffing clothes as well as reaching out to the side due to pain. Pt will benefit from skilled physical therapy services to address the aforementioned deficits.    Personal Factors and Comorbidities Comorbidity 1;Time  since onset of injury/illness/exacerbation;Profession;Fitness    Comorbidities MS    Examination-Activity Limitations Bathing;Reach Overhead;Dressing;Hygiene/Grooming;Toileting;Lift;Carry    Stability/Clinical Decision Making Stable/Uncomplicated    Clinical Decision Making Low    Rehab Potential Fair    PT Frequency 2x / week    PT Duration 8 weeks    PT Treatment/Interventions Therapeutic activities;Therapeutic exercise;Neuromuscular re-education;Patient/family education;Manual techniques;Dry needling;Spinal Manipulations;Joint Manipulations;Electrical Stimulation;Iontophoresis '4mg'$ /ml Dexamethasone    PT Next Visit Plan scapular, ER and IR strengthening, ROM, manual techniques, modalities PRN    PT Home Exercise Plan Medbridge Access Code: [email or web address]    Consulted and Agree with Plan of Care Patient             Patient will benefit from skilled therapeutic intervention in order to improve the following deficits and impairments:  Pain, Postural dysfunction, Impaired UE functional use, Improper body mechanics, Decreased strength, Decreased range of motion  Visit Diagnosis: Chronic right shoulder pain - Plan: PT plan of care cert/re-cert  Muscle weakness (generalized) - Plan: PT plan of care cert/re-cert  Cervicalgia - Plan: PT plan of care cert/re-cert     Problem List 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, [place], [address] Phone: [phone]   Fax:  [phone]  Name: Peachie Barkalow MRN: [id number] Date of Birth: 09/07/1971

## 2022-08-29 ENCOUNTER — Ambulatory Visit: Payer: 59

## 2022-08-29 ENCOUNTER — Telehealth: Payer: Self-pay

## 2022-08-29 NOTE — Telephone Encounter (Signed)
No show. Called patient who said that she did not realize she had an appointment today. Will be able to make it to her next follow up session. Shoulder is feeling better. Able to reach back better more comfortably.

## 2022-08-29 NOTE — Therapy (Signed)
Note opened in error.   No show. Called patient who said that she did not realize she had an appointment today. Will be able to make it to her next follow up session. Shoulder is feeling better. Able to reach back better more comfortably.

## 2022-08-31 ENCOUNTER — Ambulatory Visit: Payer: 59

## 2022-08-31 DIAGNOSIS — M6281 Muscle weakness (generalized): Secondary | ICD-10-CM | POA: Diagnosis not present

## 2022-08-31 DIAGNOSIS — M25811 Other specified joint disorders, right shoulder: Secondary | ICD-10-CM | POA: Diagnosis not present

## 2022-08-31 DIAGNOSIS — G8929 Other chronic pain: Secondary | ICD-10-CM

## 2022-08-31 DIAGNOSIS — M542 Cervicalgia: Secondary | ICD-10-CM

## 2022-08-31 DIAGNOSIS — M25511 Pain in right shoulder: Secondary | ICD-10-CM | POA: Diagnosis not present

## 2022-08-31 NOTE — Therapy (Signed)
OUTPATIENT PHYSICAL THERAPY TREATMENT NOTE   Patient Name: Hannah Terry MRN: [id number] DOB:Apr 12, 1971, 51 y.o., female Today's Date: 08/31/2022  PCP: Steele Sizer, MD REFERRING PROVIDER: Steele Sizer, MD    PT End of Session - 08/31/22 1719     Visit Number 2    Number of Visits 17    Date for PT Re-Evaluation 10/20/22    Authorization Type 2    Authorization Time Period of 10 progress report    PT Start Time 1719    PT Stop Time 1805    PT Time Calculation (min) 46 min    Activity Tolerance Patient tolerated treatment well    Behavior During Therapy WFL for tasks assessed/performed             Past Medical History:  Diagnosis Date   Acanthosis nigricans    B12 deficiency    Chronic vaginitis    Multiple sclerosis ([place])    Lesion on C Spine   Obesity    Vitamin D deficiency    Past Surgical History:  Procedure Laterality Date   COLONOSCOPY WITH PROPOFOL N/A 04/23/2021   Procedure: COLONOSCOPY WITH PROPOFOL;  Surgeon: Lucilla Lame, MD;  Location: [place];  Service: Endoscopy;  Laterality: N/A;   ESOPHAGOGASTRODUODENOSCOPY (EGD) WITH PROPOFOL N/A 04/23/2021   Procedure: ESOPHAGOGASTRODUODENOSCOPY (EGD) WITH PROPOFOL;  Surgeon: Lucilla Lame, MD;  Location: [place];  Service: Endoscopy;  Laterality: N/A;   POLYPECTOMY  04/23/2021   Procedure: POLYPECTOMY;  Surgeon: Lucilla Lame, MD;  Location: [facility];  Service: Endoscopy;;   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 Pain located anterior shoulder. Gradual onset for the past 6 months. R hand dominant. Pain is about the same since onset. 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 Also has R neck stiffness. Stopped taking Tecfedera years ago for MS secondary to side effects of facial flushing.  PRECAUTIONS: Multiple sclerosis   SUBJECTIVE: R shoulder is feeling a lot better.   PAIN:  Are you having pain? 3-4/10 when raising her R arm to the side.      TODAY'S TREATMENT:       Therapeutic exercise   Seated manually resisted triceps extension isometrics 10x3 with 5 second holds  Standing shoulder isometrics at wall/door  ER 5 seconds. R shoulder discomfort, eases with rest   Then with gentle caudal distraction R shoulder joint 10x5 seconds for 3 sets, no pain   IR 10x5 seconds for 3 sets   Abduction (shoulder at neutral) 5 seconds. R shoulder discomfort, eases slowly with rest.    Seated self inferior shoulder mob R 10x5 seconds for 3 sets  Seated assisted R shoulder abduction with PT manually resisted return to adduction 10x3  Decreased R shoulder pain with shoulder abduction afterwards   Standing R shoulder adduction red band 10x2      Improved exercise technique, movement at target joints, use of target muscles after mod verbal, visual, tactile cues.        Response to treatment Pt tolerated session well without aggravation of symptoms. Decreased R shoulder pain with shoulder abduction reported after session.       Clinical impression  Decreased R shoulder pain with treatment to improve joint space, promote posterior  glide of humeral head as well as improving R shoulder adductor strength to help promote inferior glide of humeral head during R shoulder abduction. Pt will benefit from continued skilled physical therapy services to decrease pain, improve strength, ROM, and function.      PATIENT EDUCATION: Education details: ther-ex, HEP Person educated: Patient Education method: Explanation, Demonstration, Tactile cues, Verbal cues, and Handouts Education comprehension: verbalized understanding and returned demonstration   HOME EXERCISE PROGRAM: Access Code: [email or web address] URL: [email or web address] Date: 08/24/2022 Prepared by: Joneen Boers   Exercises - Seated Scapular Retraction  - 1 x daily - 7 x weekly - 3 sets - 10 reps - 5 seconds hold - Seated Shoulder Inferior Glide  - 1 x daily - 7 x weekly - 3 sets - 10 reps - 5 seconds hold     PT Short Term Goals - 08/24/22 1827       PT SHORT TERM GOAL #1   Title Pt will be independent with her initial HEP to decrease pain, improve strength, function, and ability to reach and don/doff shirts more comfortably.    Baseline Pt has started her initial HEP (08/24/2022)    Time 3    Period Weeks    Status New    Target Date 09/15/22              PT Long Term Goals - 08/24/22 1831       PT LONG TERM GOAL #1   Title Pt will have a decrease in R shoulder pain to 4/10 or less at worst to promote ability to don/doff shirts, as well as reach more comfortably.    Baseline 9/10 R shoulder pain at worst for the past 3 months (08/24/2022)    Time 8    Period Weeks    Status New    Target Date 10/20/22      PT LONG TERM GOAL #2   Title Pt will improve her shoulder FOTO score by at least 10 points as a demonstration of improved function.    Baseline R shoulder FOTO 58 (08/24/2022)    Time 8    Period Weeks    Status New    Target Date 10/20/22      PT LONG TERM GOAL #3   Title Pt will improve her R shoulder functional IR AROM to R thumb to  T7 spinous process to promote ability to don and doff clothes more comfortably.    Baseline Functional R shoulder AROM: R thumb to coccyx (08/24/2022)    Time 8    Period Weeks    Status New    Target Date 10/20/22      PT LONG TERM GOAL #4   Title Pt will improve R shoulder abduction AROM to 155 degrees or more to promote ability to reach more comfortably.    Baseline R shoulder abduction AROM 135 degrees with pain (08/24/2022)    Time 8    Period Weeks    Status New  Target Date 10/20/22              Plan - 08/31/22 1719     Clinical Impression Statement Decreased R shoulder pain with treatment to improve joint space, promote posterior glide of humeral head as well as improving R shoulder adductor strength to help promote inferior glide of humeral head during R shoulder abduction. Pt will benefit from continued skilled physical therapy services to decrease pain, improve strength, ROM, and function.    Personal Factors and Comorbidities Comorbidity 1;Time since onset of injury/illness/exacerbation;Profession;Fitness    Comorbidities MS    Examination-Activity Limitations Bathing;Reach Overhead;Dressing;Hygiene/Grooming;Toileting;Lift;Carry    Stability/Clinical Decision Making Stable/Uncomplicated    Clinical Decision Making Low    Rehab Potential Fair    PT Frequency 2x / week    PT Duration 8 weeks    PT Treatment/Interventions Therapeutic activities;Therapeutic exercise;Neuromuscular re-education;Patient/family education;Manual techniques;Dry needling;Spinal Manipulations;Joint Manipulations;Electrical Stimulation;Iontophoresis '4mg'$ /ml Dexamethasone    PT Next Visit Plan scapular, ER and IR strengthening, ROM, manual techniques, modalities PRN    PT Home Exercise Plan Medbridge Access Code: [email or web address]    Consulted and Agree with Plan of Care Patient              Joneen Boers PT, DPT  08/31/2022, 6:10 PM

## 2022-09-05 ENCOUNTER — Ambulatory Visit: Payer: 59

## 2022-09-05 DIAGNOSIS — M542 Cervicalgia: Secondary | ICD-10-CM | POA: Diagnosis not present

## 2022-09-05 DIAGNOSIS — M25811 Other specified joint disorders, right shoulder: Secondary | ICD-10-CM | POA: Diagnosis not present

## 2022-09-05 DIAGNOSIS — G8929 Other chronic pain: Secondary | ICD-10-CM | POA: Diagnosis not present

## 2022-09-05 DIAGNOSIS — M6281 Muscle weakness (generalized): Secondary | ICD-10-CM | POA: Diagnosis not present

## 2022-09-05 DIAGNOSIS — M25511 Pain in right shoulder: Secondary | ICD-10-CM | POA: Diagnosis not present

## 2022-09-05 NOTE — Therapy (Signed)
OUTPATIENT PHYSICAL THERAPY TREATMENT NOTE   Patient Name: Hannah Terry MRN: [id number] DOB:06-15-1971, 51 y.o., female Today's Date: 09/05/2022  PCP: Steele Sizer, MD REFERRING PROVIDER: Steele Sizer, MD    PT End of Session - 09/05/22 1715     Visit Number 3    Number of Visits 17    Date for PT Re-Evaluation 10/20/22    Authorization Type 3    Authorization Time Period of 10 progress report    PT Start Time 1716    PT Stop Time 1758    PT Time Calculation (min) 42 min    Activity Tolerance Patient tolerated treatment well    Behavior During Therapy WFL for tasks assessed/performed              Past Medical History:  Diagnosis Date   Acanthosis nigricans    B12 deficiency    Chronic vaginitis    Multiple sclerosis ([place])    Lesion on C Spine   Obesity    Vitamin D deficiency    Past Surgical History:  Procedure Laterality Date   COLONOSCOPY WITH PROPOFOL N/A 04/23/2021   Procedure: COLONOSCOPY WITH PROPOFOL;  Surgeon: Lucilla Lame, MD;  Location: [facility];  Service: Endoscopy;  Laterality: N/A;   ESOPHAGOGASTRODUODENOSCOPY (EGD) WITH PROPOFOL N/A 04/23/2021   Procedure: ESOPHAGOGASTRODUODENOSCOPY (EGD) WITH PROPOFOL;  Surgeon: Lucilla Lame, MD;  Location: [facility];  Service: Endoscopy;  Laterality: N/A;   POLYPECTOMY  04/23/2021   Procedure: POLYPECTOMY;  Surgeon: Lucilla Lame, MD;  Location: [facility];  Service: Endoscopy;;   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 Pain located anterior shoulder. Gradual onset for the past 6 months. R hand dominant. Pain is about the same since onset. 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 Also has R neck stiffness. Stopped taking Tecfedera years ago for MS secondary to side effects of facial flushing.  PRECAUTIONS: Multiple sclerosis   SUBJECTIVE: R shoulder is feeling a lot better. Pt can reach behind her and put her bra and T-shirt on. Also has B upper trap muscle tension.   PAIN:  Are you having pain? 0/10 when moving her arm.      TODAY'S TREATMENT:       Therapeutic exercise   Standing B scapular retraction yellow band 10x3 with 5 second holds  Standing B shoulder extension with scapular retraction 10x, then 10x5 seconds for 2 sets yellow band  Standing R shoulder adduction red band 10x then 10x5 seconds for 2 sets  Standing R triceps extension isometrics 10x3 with 5 second holds to promote posterior glide of humeral head  Improved functional IR ROM observed   Standing shoulder isometrics at wall/door  ER with gentle caudal distraction  R shoulder joint 10x5 seconds for 3 sets, no pain     Improved exercise technique, movement at target joints, use of target muscles after mod verbal, visual, tactile cues.       Manual therapy  Seated STM B upper trap muscles to decrease tension and improve glenohumeral mechanics.      Response to treatment Pt tolerated session well without aggravation of symptoms. Functional IR: R thumb to bra line.       Clinical impression  Pt progressing very well with decreased R shoulder pain and  improved function based on subjective reports. Significant improvement in R shoulder functional IR compared to initial eval with R shoulder functional IR ROM almost equal to her L shoulder today. Continued working on improving scapular and ER muscle strength, improving posterior as well as inferior humeral head glide when raising her arm up to improve arthro kinematics. Pt tolerated session well without aggravation of symptoms. Functional shoulder IR: R thumb to bra line after session. Pt will benefit from continued skilled physical therapy services to decrease pain, improve strength, ROM, and function.      PATIENT EDUCATION: Education details: ther-ex, HEP Person educated: Patient Education method: Explanation, Demonstration, Tactile cues, Verbal cues, and Handouts Education comprehension: verbalized understanding and returned demonstration   HOME EXERCISE PROGRAM: Access Code: [email or web address] URL: [email or web address] Date: 08/24/2022 Prepared by: Joneen Boers   Exercises - Seated Scapular Retraction  - 1 x daily - 7 x weekly - 3 sets - 10 reps - 5 seconds hold - Seated Shoulder Inferior Glide  - 1 x daily - 7 x weekly - 3 sets - 10 reps - 5 seconds hold - Scapular Retraction with Resistance  - 1 x daily - 7 x weekly - 3 sets - 10 reps - 5 seconds hold   Yellow band - Isometric Tricep Extension   - 1 x daily - 7 x weekly - 3 sets - 10 reps - 5 seconds hold   PT Short Term Goals - 08/24/22 1827       PT SHORT TERM GOAL #1   Title Pt will be independent with her initial HEP to decrease pain, improve strength, function, and ability to reach and don/doff shirts more comfortably.    Baseline Pt has started her initial HEP (08/24/2022)    Time 3    Period Weeks    Status New    Target Date 09/15/22              PT Long Term Goals - 08/24/22 1831       PT LONG TERM GOAL #1   Title Pt will have a decrease in R shoulder pain to 4/10 or less at worst to promote ability to  don/doff shirts, as well as reach more comfortably.    Baseline 9/10 R shoulder pain at worst for the past 3 months (08/24/2022)    Time 8    Period Weeks    Status New    Target Date 10/20/22      PT LONG TERM GOAL #2   Title Pt will improve her shoulder FOTO score by at least 10 points as a demonstration of improved function.    Baseline R shoulder FOTO 58 (08/24/2022)    Time 8    Period Weeks    Status New    Target Date 10/20/22      PT LONG TERM GOAL #3   Title Pt will improve her R shoulder functional IR AROM to R thumb  to T7 spinous process to promote ability to don and doff clothes more comfortably.    Baseline Functional R shoulder AROM: R thumb to coccyx (08/24/2022)    Time 8    Period Weeks    Status New    Target Date 10/20/22      PT LONG TERM GOAL #4   Title Pt will improve R shoulder abduction AROM to 155 degrees or more to promote ability to reach more comfortably.    Baseline R shoulder abduction AROM 135 degrees with pain (08/24/2022)    Time 8    Period Weeks    Status New    Target Date 10/20/22              Plan - 09/05/22 1715     Clinical Impression Statement Pt progressing very well with decreased R shoulder pain and improved function based on subjective reports. Significant improvement in R shoulder functional IR compared to initial eval with R shoulder functional IR ROM almost equal to her L shoulder today. Continued working on improving scapular and ER muscle strength, improving posterior as well as inferior humeral head glide when raising her arm up to improve arthro kinematics. Pt tolerated session well without aggravation of symptoms. Functional shoulder IR: R thumb to bra line after session. Pt will benefit from continued skilled physical therapy services to decrease pain, improve strength, ROM, and function.    Personal Factors and Comorbidities Comorbidity 1;Time since onset of injury/illness/exacerbation;Profession;Fitness    Comorbidities MS     Examination-Activity Limitations Bathing;Reach Overhead;Dressing;Hygiene/Grooming;Toileting;Lift;Carry    Stability/Clinical Decision Making Stable/Uncomplicated    Rehab Potential Fair    PT Frequency 2x / week    PT Duration 8 weeks    PT Treatment/Interventions Therapeutic activities;Therapeutic exercise;Neuromuscular re-education;Patient/family education;Manual techniques;Dry needling;Spinal Manipulations;Joint Manipulations;Electrical Stimulation;Iontophoresis '4mg'$ /ml Dexamethasone    PT Next Visit Plan scapular, ER and IR strengthening, ROM, manual techniques, modalities PRN    PT Home Exercise Plan Medbridge Access Code: [email or web address]    Consulted and Agree with Plan of Care Patient               Joneen Boers PT, DPT  09/05/2022, 6:10 PM

## 2022-09-07 ENCOUNTER — Telehealth: Payer: Self-pay

## 2022-09-07 ENCOUNTER — Ambulatory Visit: Payer: 59

## 2022-09-07 NOTE — Telephone Encounter (Signed)
No show. Called patient who said that she forgot to call and cancel because she got busy at work. Will be able to make it to next session.

## 2022-09-12 ENCOUNTER — Ambulatory Visit: Payer: 59

## 2022-09-14 ENCOUNTER — Ambulatory Visit: Payer: 59

## 2022-09-19 ENCOUNTER — Ambulatory Visit: Payer: 59 | Attending: Family Medicine

## 2022-09-19 DIAGNOSIS — G8929 Other chronic pain: Secondary | ICD-10-CM | POA: Insufficient documentation

## 2022-09-19 DIAGNOSIS — M542 Cervicalgia: Secondary | ICD-10-CM | POA: Diagnosis present

## 2022-09-19 DIAGNOSIS — M25511 Pain in right shoulder: Secondary | ICD-10-CM | POA: Insufficient documentation

## 2022-09-19 DIAGNOSIS — M6281 Muscle weakness (generalized): Secondary | ICD-10-CM | POA: Insufficient documentation

## 2022-09-19 NOTE — Therapy (Addendum)
OUTPATIENT PHYSICAL THERAPY TREATMENT NOTE And Discharge Summary   Patient Name: Hannah Terry MRN: [id number] DOB:[birth year], 51 y.o., female Today's Date: 09/19/2022  PCP: Steele Sizer, MD REFERRING PROVIDER: Steele Sizer, MD    PT End of Session - 09/19/22 1719     Visit Number 4    Number of Visits 17    Date for PT Re-Evaluation 10/20/22    Authorization Type 4    Authorization Time Period of 10 progress report    PT Start Time 1719    PT Stop Time 1750    PT Time Calculation (min) 31 min    Activity Tolerance Patient tolerated treatment well    Behavior During Therapy WFL for tasks assessed/performed               Past Medical History:  Diagnosis Date   Acanthosis nigricans    B12 deficiency    Chronic vaginitis    Multiple sclerosis ([place])    Lesion on C Spine   Obesity    Vitamin D deficiency    Past Surgical History:  Procedure Laterality Date   COLONOSCOPY WITH PROPOFOL N/A 04/23/2021   Procedure: COLONOSCOPY WITH PROPOFOL;  Surgeon: Lucilla Lame, MD;  Location: [facility];  Service: Endoscopy;  Laterality: N/A;   ESOPHAGOGASTRODUODENOSCOPY (EGD) WITH PROPOFOL N/A 04/23/2021   Procedure: ESOPHAGOGASTRODUODENOSCOPY (EGD) WITH PROPOFOL;  Surgeon: Lucilla Lame, MD;  Location: [facility];  Service: Endoscopy;  Laterality: N/A;   POLYPECTOMY  04/23/2021   Procedure: POLYPECTOMY;  Surgeon: Lucilla Lame, MD;  Location: [facility];  Service: Endoscopy;;   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 Pain located anterior shoulder. Gradual onset for the past 6 months. R hand dominant. Pain is about the same since onset. 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 Stopped taking Tecfedera years ago for MS secondary to side effects of facial flushing.  PRECAUTIONS: Multiple sclerosis   SUBJECTIVE: R shoulder Has been doing really great... about 90% better. Has to leave by 5:50 pm  PAIN:  Are you having pain? 0/10 when moving her arm.      TODAY'S TREATMENT:       Therapeutic exercise    Standing R shoulder functional IR 1x  Standing R shoulder abduction AROM 1x   Reviewed progress/current status with PT towards goals   Standing upper trap stretch  R 30 seconds for 3 sets  L 30 seconds for 3 sets   Seated manually resisted scapular retraction targeting lower trap muscles  R 10x5 seconds for 2 sets  L 10x5 seconds for 2 sets      Improved exercise technique, movement at target joints, use of target muscles after mod  verbal, visual, tactile cues.       Manual therapy  Seated STM B upper trap muscles to decrease tension and improve glenohumeral mechanics.      Response to treatment Pt tolerated session well without aggravation of symptoms. Functional IR: R thumb to bra line.       Clinical impression  Pt demonstrates significant improvement in shoulder pain, AROM, and function since initial evaluation. Pt has achieved all goals. Skilled physical therapy services discharged with pt continuing progress with her exercises at home.              PATIENT EDUCATION: Education details: ther-ex, HEP Person educated: Patient Education method: Explanation, Demonstration, Tactile cues, Verbal cues, and Handouts Education comprehension: verbalized understanding and returned demonstration   HOME EXERCISE PROGRAM: Access Code: [email or web address] URL: [email or web address] Date: 08/24/2022 Prepared by: Joneen Boers   Exercises - Seated Scapular Retraction  - 1 x daily - 7 x weekly - 3 sets - 10 reps - 5 seconds hold - Seated Shoulder Inferior Glide  - 1 x daily - 7 x weekly - 3 sets - 10 reps - 5 seconds hold - Scapular Retraction with Resistance  - 1 x daily - 7 x weekly - 3 sets - 10 reps - 5 seconds hold   Yellow band - Isometric Tricep Extension   - 1 x daily - 7 x weekly - 3 sets - 10 reps - 5 seconds hold   PT Short Term Goals - 09/19/22 1755       PT SHORT TERM GOAL #1   Title Pt will be independent with her initial HEP to decrease pain, improve strength, function, and ability to reach and don/doff shirts more comfortably.    Baseline Pt has started her initial HEP (08/24/2022); Pt performing her HEP, independent (09/19/2022)    Time 3    Period Weeks    Status Achieved    Target Date 09/15/22              PT Long Term Goals - 09/19/22 1721       PT LONG TERM GOAL #1   Title Pt will have a decrease in R shoulder pain to 4/10 or less at worst to promote ability to don/doff shirts, as well as reach more comfortably.    Baseline 9/10 R shoulder pain at worst for the past 3 months (08/24/2022); 2-3/10 at worst for the past 7 days (09/19/2022)    Time 8    Period Weeks    Status Achieved    Target Date 10/20/22      PT LONG TERM GOAL #2   Title Pt will improve her shoulder FOTO score by at least 10 points as a demonstration of improved function.    Baseline R shoulder FOTO 58 (08/24/2022); 76 (09/19/2022)    Time 8    Period Weeks    Status Achieved    Target Date 10/20/22      PT LONG TERM GOAL #3   Title Pt  will improve her R shoulder functional IR AROM to R thumb to T7 spinous process to promote ability to don and doff clothes more comfortably.    Baseline Functional R shoulder AROM: R thumb to coccyx (08/24/2022); R thumb to T7 spinous process (09/19/2022)    Time 8    Period Weeks    Status Achieved    Target Date 10/20/22      PT LONG TERM GOAL #4   Title Pt will improve R shoulder  abduction AROM to 155 degrees or more to promote ability to reach more comfortably.    Baseline R shoulder abduction AROM 135 degrees with pain (08/24/2022); 175 degrees abduction (09/19/2022)    Time 8    Period Weeks    Status Achieved    Target Date 10/20/22              Plan - 09/19/22 1719     Clinical Impression Statement Pt demonstrates significant improvement in shoulder pain, AROM, and function since initial evaluation. Pt has achieved all goals. Skilled physical therapy services discharged with pt continuing progress with her exercises at home.    Personal Factors and Comorbidities Comorbidity 1;Time since onset of injury/illness/exacerbation;Profession;Fitness    Comorbidities MS    Examination-Activity Limitations Bathing;Reach Overhead;Dressing;Hygiene/Grooming;Toileting;Lift;Carry    Stability/Clinical Decision Making Stable/Uncomplicated    Clinical Decision Making Low    Rehab Potential --    PT Frequency --    PT Duration --    PT Treatment/Interventions Therapeutic activities;Therapeutic exercise;Neuromuscular re-education;Patient/family education;Manual techniques    PT Next Visit Plan Continue progress with her exercises at home.    PT Home Exercise Plan Medbridge Access Code: [email or web address]    Consulted and Agree with Plan of Care Patient               Thank you for your referral.  Joneen Boers PT, DPT  09/19/2022, 5:59 PM

## 2022-10-03 ENCOUNTER — Ambulatory Visit: Payer: 59 | Admitting: Dermatology

## 2022-10-03 DIAGNOSIS — L700 Acne vulgaris: Secondary | ICD-10-CM

## 2022-10-03 DIAGNOSIS — L819 Disorder of pigmentation, unspecified: Secondary | ICD-10-CM

## 2022-10-03 MED ORDER — SPIRONOLACTONE 25 MG PO TABS: 25.0000 mg | ORAL_TABLET | Freq: Every morning | ORAL | 1 refills | Status: DC

## 2022-10-03 MED ORDER — DOXYCYCLINE HYCLATE 100 MG PO TABS: 100.0000 mg | ORAL_TABLET | Freq: Every evening | ORAL | 1 refills | Status: AC

## 2022-10-03 NOTE — Progress Notes (Signed)
   New Patient Visit  Subjective  Hannah Terry is a 51 y.o. female who presents for the following: Acne (New patient - acne with discoloration of face - she gets big painful bumps and they leave a scar).  The following portions of the chart were reviewed this encounter and updated as appropriate:   Tobacco  Allergies  Meds  Problems  Med Hx  Surg Hx  Fam Hx     Review of Systems:  No other skin or systemic complaints except as noted in HPI or Assessment and Plan.  Objective  Well appearing patient in no apparent distress; mood and affect are within normal limits.  A focused examination was performed including face. Relevant physical exam findings are noted in the Assessment and Plan.  Face Hyperpigmented macules And scarring and papules  BP - 121/80   Assessment & Plan  Acne vulgaris Severe with dyschromia and scarring Face  Discussed treatment options. Recommend Isotretinoin.  Reviewed isotretinoin in detail including birth control monthly office visits side effects.   She has and IUD so she is not sure if she is perimenopausal or postmenopausal. Advised that we can order labs to determine. Will order labs as well as an HCG test today.  Reviewed potential side effects of isotretinoin including xerosis, cheilitis, hepatitis, hyperlipidemia, and severe birth defects if taken by a pregnant woman.  Women on isotretinoin must be celibate (not having sex) or required to use at least 2 birth control methods to prevent pregnancy (unless patient is a female of non-child bearing potential).  Females of child-bearing potential must have monthly pregnancy tests while on isotretinoin and report through [employer] (FDA monitoring program). Reviewed reports of suicidal ideation in those with a history of depression while taking isotretinoin and reports of diagnosis of inflammatory bowl disease (IBD) while taking isotretinoin as well as the lack of evidence for a causal relationship  between isotretinoin, depression and IBD. Patient advised to reach out with any questions or concerns. Patient advised not to share pills or donate blood while on treatment or for one month after completing treatment. All patient's considering Isotretinoin must read and understand and sign Isotretinoin Consent Form and be registered with [employer].  While taking Isotretinoin and for 30 days after you finish the medication, do not get pregnant, do not share pills, do not donate blood.  Generic isotretinoin is best absorbed when taken with a fatty meal. Isotretinoin can make you sensitive to the sun. Daily careful sun protection including sunscreen SPF 30+ when outdoors is recommended.  Start Spironolactone 25 mg 1 po qd - may increase if she is tolerating and not improving. Doxycycline 100 mg 1 po qpm with food and plenty of fluid  Follicle Stimulating Hormone - Face  Luteinizing Hormone - Face  HCG, Qualitative - Face  doxycycline (VIBRA-TABS) 100 MG tablet - Face Take 1 tablet (100 mg total) by mouth every evening. With food and plenty of fluid  spironolactone (ALDACTONE) 25 MG tablet - Face Take 1 tablet (25 mg total) by mouth every morning.  Return in about 1 month (around 11/03/2022) for Acne.  I, Ashok Cordia, CMA, am acting as scribe for Sarina Ser, MD . Documentation: I have reviewed the above documentation for accuracy and completeness, and I agree with the above.  Sarina Ser, MD

## 2022-10-03 NOTE — Patient Instructions (Signed)
Doxycycline should be taken with food to prevent nausea. Do not lay down for 30 minutes after taking. Be cautious with sun exposure and use good sun protection while on this medication. Pregnant women should not take this medication.    Spironolactone can cause increased urination and cause blood pressure to decrease. Please watch for signs of lightheadedness and be cautious when changing position. It can sometimes cause breast tenderness or an irregular period in premenopausal women. It can also increase potassium. The increase in potassium usually is not a concern unless you are taking other medicines that also increase potassium, so please be sure your doctor knows all of the other medications you are taking. This medication should not be taken by pregnant women.  This medicine should also not be taken together with sulfa drugs like Bactrim (trimethoprim/sulfamethexazole).    Due to recent changes in healthcare laws, you may see results of your pathology and/or laboratory studies on MyChart before the doctors have had a chance to review them. We understand that in some cases there may be results that are confusing or concerning to you. Please understand that not all results are received at the same time and often the doctors may need to interpret multiple results in order to provide you with the best plan of care or course of treatment. Therefore, we ask that you please give [place] 2 business days to thoroughly review all your results before contacting the office for clarification. Should we see a critical lab result, you will be contacted sooner.   If You Need Anything After Your Visit  If you have any questions or concerns for your doctor, please call our main line at [phone] and press option 4 to reach your doctor's medical assistant. If no one answers, please leave a voicemail as directed and we will return your call as soon as possible. Messages left after 4 pm will be answered the following  business day.   You may also send [place] a message via [place]. We typically respond to MyChart messages within 1-2 business days.  For prescription refills, please ask your pharmacy to contact our office. Our fax number is [phone].  If you have an urgent issue when the clinic is closed that cannot wait until the next business day, you can page your doctor at the number below.    Please note that while we do our best to be available for urgent issues outside of office hours, we are not available 24/7.   If you have an urgent issue and are unable to reach [place], you may choose to seek medical care at your doctor's office, retail clinic, urgent care center, or emergency room.  If you have a medical emergency, please immediately call 911 or go to the emergency department.  Pager Numbers  - Dr. Nehemiah Massed: [phone]  - Dr. Laurence Ferrari: [phone]  - Dr. Nicole Kindred: [phone]  In the event of inclement weather, please call our main line at [phone] for an update on the status of any delays or closures.  Dermatology Medication Tips: Please keep the boxes that topical medications come in in order to help keep track of the instructions about where and how to use these. Pharmacies typically print the medication instructions only on the boxes and not directly on the medication tubes.   If your medication is too expensive, please contact our office at [phone] option 4 or send [place] a message through [place].   We are unable to tell what your co-pay for medications will  be in advance as this is different depending on your insurance coverage. However, we may be able to find a substitute medication at lower cost or fill out paperwork to get insurance to cover a needed medication.   If a prior authorization is required to get your medication covered by your insurance company, please allow [place] 1-2 business days to complete this process.  Drug prices often vary depending on where the prescription is  filled and some pharmacies may offer cheaper prices.  The website www.goodrx.com contains coupons for medications through different pharmacies. The prices here do not account for what the cost may be with help from insurance (it may be cheaper with your insurance), but the website can give you the price if you did not use any insurance.  - You can print the associated coupon and take it with your prescription to the pharmacy.  - You may also stop by our office during regular business hours and pick up a GoodRx coupon card.  - If you need your prescription sent electronically to a different pharmacy, notify our office through [facility] or by phone at [phone] option 4.     Si Usted Necesita Algo Despus de Su Visita  Tambin puede enviarnos un mensaje a travs de Pharmacist, community. Por lo general respondemos a los mensajes de MyChart en el transcurso de 1 a 2 das hbiles.  Para renovar recetas, por favor pida a su farmacia que se ponga en contacto con nuestra oficina. Harland Dingwall de fax es [place] [phone].  Si tiene un asunto urgente cuando la clnica est cerrada y que no puede esperar hasta el siguiente da hbil, puede llamar/localizar a su doctor(a) al nmero que aparece a continuacin.   Por favor, tenga en cuenta que aunque hacemos todo lo posible para estar disponibles para asuntos urgentes fuera del horario de [place], no estamos disponibles las 24 horas del da, los 7 das de la [place].   Si tiene un problema urgente y no puede comunicarse con nosotros, puede optar por buscar atencin mdica  en el consultorio de su doctor(a), en una clnica privada, en un centro de atencin urgente o en una sala de emergencias.  Si tiene Engineering geologist, por favor llame inmediatamente al 911 o vaya a la sala de emergencias.  Nmeros de bper  - Dr. Nehemiah Massed: [phone]  - Dra. Moye: [phone]  - Dra. Nicole Kindred: [phone]  En caso de inclemencias del [place], por favor llame  a Johnsie Kindred principal al [phone] para una actualizacin sobre el [place] de cualquier retraso o cierre.  Consejos para la medicacin en dermatologa: Por favor, guarde las cajas en las que vienen los medicamentos de uso tpico para ayudarle a seguir las instrucciones sobre dnde y cmo usarlos. Las farmacias generalmente imprimen las instrucciones del medicamento slo en las cajas y no directamente en los tubos del [place].   Si su medicamento es muy caro, por favor, pngase en contacto con Zigmund Daniel llamando al [phone] y presione la opcin 4 o envenos un mensaje a travs de Pharmacist, community.   No podemos decirle cul ser su copago por los medicamentos por adelantado ya que esto es diferente dependiendo de la cobertura de su seguro. Sin embargo, es posible que podamos encontrar un medicamento sustituto a Electrical engineer un formulario para que el seguro cubra el medicamento que se considera necesario.   Si se requiere una autorizacin previa para que su compaa de seguros [place] su medicamento, por favor permtanos de 1  a 2 das hbiles para completar este proceso.  Los precios de los medicamentos varan con frecuencia dependiendo del Environmental consultant de dnde se surte la receta y alguna farmacias pueden ofrecer precios ms baratos.  El sitio web www.goodrx.com tiene cupones para medicamentos de Airline pilot. Los precios aqu no tienen en cuenta lo que podra costar con la ayuda del seguro (puede ser ms barato con su seguro), pero el sitio web puede darle el precio si no utiliz Research scientist (physical sciences).  - Puede imprimir el cupn correspondiente y llevarlo con su receta a la farmacia.  - Tambin puede pasar por nuestra oficina durante el horario de atencin regular y Charity fundraiser una tarjeta de cupones de GoodRx.  - Si necesita que su receta se enve electrnicamente a una farmacia diferente, informe a nuestra oficina a travs de MyChart de [place] o por telfono llamando al [phone] y  presione la opcin 4.

## 2022-10-14 ENCOUNTER — Encounter: Payer: Self-pay | Admitting: Dermatology

## 2022-11-02 ENCOUNTER — Ambulatory Visit: Payer: 59 | Admitting: Dermatology

## 2022-11-15 ENCOUNTER — Other Ambulatory Visit: Payer: Self-pay

## 2022-11-15 DIAGNOSIS — K2100 Gastro-esophageal reflux disease with esophagitis, without bleeding: Secondary | ICD-10-CM

## 2022-12-06 ENCOUNTER — Ambulatory Visit: Admission: RE | Admit: 2022-12-06 | Payer: 59 | Source: Home / Self Care | Admitting: Gastroenterology

## 2022-12-06 ENCOUNTER — Encounter: Admission: RE | Payer: Self-pay | Source: Home / Self Care

## 2022-12-06 SURGERY — ESOPHAGOGASTRODUODENOSCOPY (EGD) WITH PROPOFOL
Anesthesia: General

## 2023-02-08 ENCOUNTER — Other Ambulatory Visit: Payer: Self-pay

## 2023-02-08 MED ORDER — LINACLOTIDE 145 MCG PO CAPS: 145.0000 ug | ORAL_CAPSULE | Freq: Every day | ORAL | 6 refills | Status: DC

## 2023-02-19 ENCOUNTER — Emergency Department: Payer: 59

## 2023-02-19 ENCOUNTER — Emergency Department
Admission: EM | Admit: 2023-02-19 | Discharge: 2023-02-19 | Disposition: A | Discharge status: Home / Self Care | Payer: 59 | Attending: Emergency Medicine | Admitting: Emergency Medicine

## 2023-02-19 ENCOUNTER — Encounter: Payer: Self-pay | Admitting: Emergency Medicine

## 2023-02-19 DIAGNOSIS — Y92009 Unspecified place in unspecified non-institutional (private) residence as the place of occurrence of the external cause: Secondary | ICD-10-CM | POA: Diagnosis not present

## 2023-02-19 DIAGNOSIS — X501XXA Overexertion from prolonged static or awkward postures, initial encounter: Secondary | ICD-10-CM | POA: Diagnosis not present

## 2023-02-19 DIAGNOSIS — M25572 Pain in left ankle and joints of left foot: Secondary | ICD-10-CM | POA: Diagnosis not present

## 2023-02-19 DIAGNOSIS — S93402A Sprain of unspecified ligament of left ankle, initial encounter: Secondary | ICD-10-CM | POA: Insufficient documentation

## 2023-02-19 DIAGNOSIS — S93492A Sprain of other ligament of left ankle, initial encounter: Secondary | ICD-10-CM | POA: Diagnosis not present

## 2023-02-19 MED ORDER — IBUPROFEN 600 MG PO TABS
600.0000 mg | ORAL_TABLET | Freq: Once | ORAL | Status: AC
  Administered 2023-02-19: 600 mg via ORAL
  Filled 2023-02-19: qty 1

## 2023-02-19 MED ORDER — ACETAMINOPHEN 500 MG PO TABS
1000.0000 mg | ORAL_TABLET | Freq: Once | ORAL | Status: AC
  Administered 2023-02-19: 1000 mg via ORAL
  Filled 2023-02-19: qty 2

## 2023-02-19 NOTE — ED Provider Notes (Signed)
[facility] Provider Note    Event Date/Time   First MD Initiated Contact with Patient 02/19/23 1111     (approximate)   History   Ankle Pain   HPI  Hannah Terry is a 52 y.o. female   Past medical history of blood sclerosis who comes in with a rolled left ankle sustained yesterday afternoon inversion injury while walking up the stairs.  Did not fall did not sustain any other injuries.  No other medical complaints.  She has pain on the lateral side of her ankle.  Has been able to walk though limping due to soreness to the lateral side of the ankle.   External Medical Documents Reviewed: Physical therapy notes from October 2023 for right shoulder pain      Physical Exam   Triage Vital Signs: ED Triage Vitals  Enc Vitals Group     BP 02/19/23 1110 117/70     Pulse Rate 02/19/23 1110 69     Resp 02/19/23 1110 20     Temp 02/19/23 1110 97.7 F (36.5 C)     Temp Source 02/19/23 1110 Oral     SpO2 02/19/23 1110 95 %     Weight 02/19/23 1109 215 lb (97.5 kg)     Height 02/19/23 1109 '5\' 5"'$  (1.651 m)     Head Circumference --      Peak Flow --      Pain Score 02/19/23 1109 8     Pain Loc --      Pain Edu? --      Excl. in [place]? --     Most recent vital signs: Vitals:   02/19/23 1110  BP: 117/70  Pulse: 69  Resp: 20  Temp: 97.7 F (36.5 C)  SpO2: 95%    General: Awake, no distress.  CV:  Good peripheral perfusion.  Resp:  Normal effort.  Abd:  No distention.  Other:  She has swelling to the lateral malleolus area on the left ankle no foot tenderness deformities, she had to have point tenderness approximately 1 cm above the left lateral malleolus.  Neurovascular intact no other obvious signs of injury.   ED Results / Procedures / Treatments   Labs (all labs ordered are listed, but only abnormal results are displayed) Labs Reviewed - No data to display   RADIOLOGY I independently reviewed and interpreted left ankle x-ray see  no fracture or dislocation   PROCEDURES:  Critical Care performed: No  Procedures   MEDICATIONS ORDERED IN ED: Medications  acetaminophen (TYLENOL) tablet 1,000 mg (1,000 mg Oral Given 02/19/23 1132)  ibuprofen (ADVIL) tablet 600 mg (600 mg Oral Given 02/19/23 1132)   IMPRESSION / MDM / [place] / ED COURSE  I reviewed the triage vital signs and the nursing notes.                                Patient's presentation is most consistent with acute presentation with potential threat to life or bodily function.  Differential diagnosis includes, but is not limited to, ankle fracture, dislocation, ankle sprain.   The patient is on the cardiac monitor to evaluate for evidence of arrhythmia and/or significant heart rate changes.  MDM: Give pain medicines including Tylenol and Motrin obtain an x-ray of the ankle.  Defer foot x-ray given no point tenderness in the foot.  She has been ambulatory.  Will discuss ankle sprain anticipatory  guidance versus orthopedics referral/management if bony injury on x-ray.  Traumatic injury causing pain doubt septic joint or other medical nontraumatic causes  No fracture or dislocation, ambulatory patient given a brace and discharge.  Anticipatory guidance re pain control and follow-up instructions given.         FINAL CLINICAL IMPRESSION(S) / ED DIAGNOSES   Final diagnoses:  Acute left ankle pain  Sprain of left ankle, unspecified ligament, initial encounter     Rx / DC Orders   ED Discharge Orders     None        Note:  This document was prepared using Dragon voice recognition software and may include unintentional dictation errors.    Lucillie Garfinkel, MD 02/19/23 1140

## 2023-02-19 NOTE — ED Notes (Signed)
Pt to ED for L ankle injury. Pt rolled ankle (inward) yesterday around 1300. Ankle is swollen and bruised appearing to lateral aspect. Full ROM. Pain with WB.

## 2023-02-19 NOTE — ED Triage Notes (Signed)
Pt via POV from home. Pt c/o L ankle pain states she was stepping up onto a stair and rolled her ankle. States she had pain and swelling since yesterday. Pt is A&OX4 and NAD

## 2023-02-19 NOTE — Discharge Instructions (Signed)
Take acetaminophen 650 mg and ibuprofen 400 mg every 6 hours for pain.  Take with food.   Use the  ankle brace for support as needed.  When your pain and swelling get better he can remove the brace and walk like you normally would.   Thank you for choosing [place] for your health care today!  Please see your primary doctor this week for a follow up appointment.   Sometimes, in the early stages of certain disease courses it is difficult to detect in the emergency department evaluation -- so, it is important that you continue to monitor your symptoms and call your doctor right away or return to the emergency department if you develop any new or worsening symptoms.  Please go to the following website to schedule new (and existing) patient appointments:   [email or web address]  If you do not have a primary doctor try calling the following clinics to establish care:  If you have insurance:  [facility] [phone] [place] [address]   [facility]  [phone] [place] [place] [address]   If you do not have insurance:  [facility]  [phone] [address], [place] [address]   The following is another list of primary care offices in the area who are accepting new patients at this time.  Please reach out to one of them directly and let them know you would like to schedule an appointment to follow up on an Emergency Department visit, and/or to establish a new primary care provider (PCP).  There are likely other primary care clinics in the are who are accepting new patients, but this is an excellent place to start:  [place] physician: Dr Lavon Paganini [address] [place], [place] [address] [phone]  [facility] Lead Physician: Dr Steele Sizer [address], [place], [place] [address] [phone]  [place] Physician: Dr Park Liter [address] [place], [place] [address] [phone]  [facility] Lead Physician: Dr Dewaine Oats [place], [place], [place] [address] [phone]  [place] [place] Physician: Dr Halina Maidens [address] [place], [place] [address] [phone]   It was my pleasure to care for you today.   Hannah Brunette Jacelyn Grip, MD

## 2023-02-22 ENCOUNTER — Telehealth: Payer: Self-pay | Admitting: *Deleted

## 2023-02-22 NOTE — Transitions of Care (Post Inpatient/ED Visit) (Signed)
   02/22/2023  Name: Hannah Terry MRN: [id number] DOB: July 09, 1971  Today's TOC FU Call Status: Today's TOC FU Call Status:: Successful TOC FU Call Competed TOC FU Call Complete Date: 02/22/23  Transition Care Management Follow-up Telephone Call Date of Discharge: 02/19/23 Discharge Facility: [facility]) Type of Discharge: Emergency Department Reason for ED Visit:  (sprain ankle) How have you been since you were released from the hospital?: Better Any questions or concerns?: No  Items Reviewed: Did you receive and understand the discharge instructions provided?: Yes Medications obtained and verified?: Yes (Medications Reviewed) Any new allergies since your discharge?: No Dietary orders reviewed?: No Do you have support at home?: Yes People in Home: sibling(s) Name of Support/Comfort Primary Source: [facility] and Equipment/Supplies: [place] Ordered?: No Any new equipment or medical supplies ordered?: No  Functional Questionnaire: Do you need assistance with bathing/showering or dressing?: No Do you need assistance with meal preparation?: No Do you need assistance with eating?: No Do you have difficulty maintaining continence: No Do you need assistance with getting out of bed/getting out of a chair/moving?: No Do you have difficulty managing or taking your medications?: No  Folllow up appointments reviewed: PCP Follow-up appointment confirmed?: NA Specialist Hospital Follow-up appointment confirmed?: NA Do you need transportation to your follow-up appointment?: No Do you understand care options if your condition(s) worsen?: Yes-patient verbalized understanding  SDOH Interventions Today    Flowsheet Row Most Recent Value  SDOH Interventions   Food Insecurity Interventions Intervention Not Indicated  Housing Interventions Intervention Not Indicated  Transportation Interventions Intervention Not Indicated       Interventions Today    Flowsheet Row Most Recent Value  General Interventions   General Interventions Discussed/Reviewed General Interventions Discussed, General Interventions Reviewed, Doctor Visits  Doctor Visits Discussed/Reviewed Doctor Visits Reviewed, Doctor Visits Discussed  [Per patient the Dr said if the pain gets worse make an appointment as needed. Per patient the pain is better]       [place] [facility] [phone]

## 2023-03-05 NOTE — Progress Notes (Unsigned)
   Established Patient Office Visit  Subjective   Patient ID: Hannah Terry, female    DOB: [birth year]  Age: 52 y.o. MRN: [id number]  No chief complaint on file.   HPI  Patient presenting for ER follow up.   Discharge Date: 02/19/23 Hospital/facility: [facility] Diagnosis: ankle sprain Procedures/tests: left ankle x-ray with prominent soft tissue swelling without acute osseous abnormality.  Consultants: None New medications: Tylenol nad Motrin PRN Discontinued medications: *** Discharge instructions:  *** Status: {Blank multiple:19196::"better","worse","stable","fluctuating"}  {History (Optional):23778}  ROS    Objective:     There were no vitals taken for this visit. {Vitals History (Optional):23777}  Physical Exam   No results found for any visits on 03/06/23.  {Labs (Optional):23779}  The 10-year ASCVD risk score (Arnett DK, et al., 2019) is: 1.8%    Assessment & Plan:   Problem List Items Addressed This Visit   None   No follow-ups on file.    Teodora Medici, DO

## 2023-03-06 ENCOUNTER — Ambulatory Visit (INDEPENDENT_AMBULATORY_CARE_PROVIDER_SITE_OTHER): Payer: 59 | Admitting: Internal Medicine

## 2023-03-06 ENCOUNTER — Encounter: Payer: Self-pay | Admitting: Internal Medicine

## 2023-03-06 VITALS — BP 122/78 | HR 78 | Temp 97.7°F | Resp 16 | Wt 217.4 lb

## 2023-03-06 DIAGNOSIS — S93432D Sprain of tibiofibular ligament of left ankle, subsequent encounter: Secondary | ICD-10-CM | POA: Diagnosis not present

## 2023-03-06 MED ORDER — NAPROXEN 500 MG PO TABS: 500.0000 mg | ORAL_TABLET | Freq: Two times a day (BID) | ORAL | 0 refills | Status: AC

## 2023-03-06 NOTE — Patient Instructions (Signed)
It was great seeing you today!  Plan discussed at today's visit: -Recommend taking Naproxen 500 mg twice a day for 10 days with food and avoid all other anti-inflammatories, Tylenol is ok  -Continue brace with activity   Follow up in: as needed   Take care and let us know if you have any questions or concerns prior to your next visit.  Dr. Rosana Berger  Ankle Sprain  An ankle sprain is a stretch or tear in a ligament in the ankle. Ligaments are tissues that connect bones to each other. The two most common types of ankle sprains are: Inversion sprain. This happens when the foot turns inward and the ankle rolls outward. It affects the ligament on the outside of the foot (lateral ligament). Eversion sprain. This happens when the foot turns outward and the ankle rolls inward. It affects the ligament on the inner side of the foot (medial ligament). What are the causes? An ankle sprain is often caused by rolling or twisting the ankle by accident. What increases the risk? You are more likely to get an ankle sprain if you play sports. What are the signs or symptoms? Symptoms of an ankle sprain include: Pain in your ankle. Swelling. Bruising. Bruises may form right after you sprain your ankle or 1-2 days later. Trouble standing or walking. This includes trouble turning or changing directions. How is this diagnosed? An ankle sprain is diagnosed with a physical exam. Your health care provider will press on parts of your foot and ankle and try to move them in certain ways. You may also have X-rays taken. These may be done to see how severe the sprain is and to check for broken bones. How is this treated? An ankle sprain may be treated with: A brace or splint. This is used to keep the ankle from moving until it heals. An elastic bandage (dressing). This is used to support the ankle. Crutches. Pain medicine. Surgery. This may be needed if the sprain is severe. Physical therapy. This may help to  improve the range of motion in the ankle. Follow these instructions at home: If you have a removable brace or a splint: Wear the brace or splint as told by your provider. Remove it only as told by your provider. Check the skin around the brace or splint every day. Tell your provider about any concerns. Loosen the brace or splint if your toes tingle, become numb, or turn cold and blue. Keep the brace or splint clean. If the brace or splint is not waterproof: Do not let it get wet. Cover it with a watertight covering when you take a bath or a shower. If you have an elastic dressing: Take the dressing off to shower or bathe. If the dressing feels too tight, adjust it to make it more comfortable. Loosen the dressing if your foot tingles, becomes numb, or turns cold and blue. Managing pain, stiffness, and swelling If told, put ice on the affected area. If you have a removable brace or splint, remove it as told by your provider. Put ice in a plastic bag. Place a towel between your skin and the bag. Leave the ice on for 20 minutes, 2-3 times a day. Remove the ice if your skin turns bright red. This is very important. If you cannot feel pain, heat, or cold, you have a greater risk of damage to the area. If your skin turns bright red, remove the ice right away to prevent skin damage. The risk of damage  is higher if you cannot feel pain, heat, or cold. Move your toes often to reduce stiffness and swelling. For 2-3 days, raise (elevate) your ankle above the level of your heart while you are sitting or lying down. General instructions Take over-the-counter and prescription medicines only as told by your provider. Do not use any products that contain nicotine or tobacco. These products include cigarettes, chewing tobacco, and vaping devices, such as e-cigarettes. If you need help quitting, ask your provider. Rest your ankle. Do not use your ankle to support your body weight until your provider says  that you can. Use crutches as told by your provider. Ask your provider when it is safe to drive if you have a brace or splint on your ankle. Contact a health care provider if: You have bruising or swelling that get worse all of a sudden. Your pain does not get better with medicine. Get help right away if: Your foot or toes become numb or blue. You have severe pain that gets worse. This information is not intended to replace advice given to you by your health care provider. Make sure you discuss any questions you have with your health care provider. Document Revised: 09/07/2022 Document Reviewed: 09/07/2022 Elsevier Patient Education  [place].

## 2023-03-29 NOTE — Progress Notes (Deleted)
Name: Hannah Terry   MRN: [id number]    DOB: 11-10-1971   Date:03/29/2023       Progress Note  Subjective  Chief Complaint  Annual Exam  HPI  Patient presents for annual CPE.  Diet: *** Exercise: ***  Last Eye Exam: *** Last Dental Exam: ***  Flowsheet Row Office Visit from 02/10/2021 in [facility]  AUDIT-C Score 0      Depression: Phq 9 is  {Desc; negative/positive:13464}    03/06/2023   11:50 AM 07/15/2022   11:50 AM 05/19/2022    1:10 PM 03/31/2022    8:14 AM 11/24/2021    9:26 AM  Depression screen PHQ 2/9  Decreased Interest 0 0 0 0 0  Down, Depressed, Hopeless 0 0 0 0 0  PHQ - 2 Score 0 0 0 0 0  Altered sleeping 0 0 0 0 0  Tired, decreased energy 0 0 0 0 0  Change in appetite 0 0 0 0 0  Feeling bad or failure about yourself  0 0 0 0 0  Trouble concentrating 0 0 0 0 0  Moving slowly or fidgety/restless 0 0 0 0 0  Suicidal thoughts 0 0 0 0 0  PHQ-9 Score 0 0 0 0 0  Difficult doing work/chores Not difficult at all       Hypertension: BP Readings from Last 3 Encounters:  03/06/23 122/78  02/19/23 117/70  07/15/22 134/80   Obesity: Wt Readings from Last 3 Encounters:  03/06/23 217 lb 6.4 oz (98.6 kg)  02/19/23 215 lb (97.5 kg)  07/15/22 217 lb (98.4 kg)   BMI Readings from Last 3 Encounters:  03/06/23 36.18 kg/m  02/19/23 35.78 kg/m  07/15/22 36.11 kg/m     Vaccines:   HPV: N/A Tdap: up to date Shingrix: N/A Pneumonia: N/A Flu: N/A COVID-19: N/A   Hep C Screening: 07/11/19 STD testing and prevention (HIV/chl/gon/syphilis): 02/10/21 Intimate partner violence: negative screen  Sexual History : Menstrual History/LMP/Abnormal Bleeding:  Discussed importance of follow up if any post-menopausal bleeding: yes  Incontinence Symptoms: negative for symptoms   Breast cancer:  - Last Mammogram: 10/03/18 - BRCA gene screening: N/A  Osteoporosis Prevention : Discussed high calcium and vitamin D supplementation, weight  bearing exercises Bone density: N/A   Cervical cancer screening: 03/16/21  Skin cancer: Discussed monitoring for atypical lesions  Colorectal cancer: 5/66/22   Lung cancer:  Low Dose CT Chest recommended if Age 100-80 years, 20 pack-year currently smoking OR have quit w/in 15years. Patient does not qualify for screen   ECG: 07/11/19  Advanced Care Planning: A voluntary discussion about advance care planning including the explanation and discussion of advance directives.  Discussed health care proxy and Living will, and the patient was able to identify a health care proxy as ***.  Patient does not have a living will and power of attorney of health care   Lipids: Lab Results  Component Value Date   CHOL 191 03/31/2022   CHOL 213 (H) 09/08/2021   CHOL 231 (H) 02/10/2021   Lab Results  Component Value Date   HDL 41 (L) 03/31/2022   HDL 43 09/08/2021   HDL 47 (L) 02/10/2021   Lab Results  Component Value Date   LDLCALC 131 (H) 03/31/2022   LDLCALC 138 (H) 09/08/2021   LDLCALC 157 (H) 02/10/2021   Lab Results  Component Value Date   TRIG 93 03/31/2022   TRIG 158 (H) 09/08/2021   TRIG 145 02/10/2021  Lab Results  Component Value Date   CHOLHDL 4.7 03/31/2022   CHOLHDL 5.0 09/08/2021   CHOLHDL 4.9 02/10/2021   No results found for: "LDLDIRECT"  Glucose: Glucose, Bld  Date Value Ref Range Status  03/31/2022 89 65 - 99 mg/dL Final    Comment:    .            Fasting reference interval .   09/08/2021 144 (H) 70 - 99 mg/dL Final    Comment:    Glucose reference range applies only to samples taken after fasting for at least 8 hours.  02/10/2021 95 65 - 99 mg/dL Final    Comment:    .            Fasting reference interval .     Patient Active Problem List   Diagnosis Date Noted   Special screening for malignant neoplasms, colon    Polyp of ascending colon    Gastroesophageal reflux disease with esophagitis without hemorrhage    Gastritis without bleeding     Allergic rhinitis, seasonal 05/08/2017   Vitamin D deficiency 02/07/2017   Encounter for routine checking of intrauterine contraceptive device (IUD) 03/31/2016   Acanthosis nigricans 02/22/2016   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   Obesity (BMI 30.0-34.9) 01/12/2016   MS (multiple sclerosis) 01/12/2016   B12 deficiency 12/23/2015   IUD contraception 04/02/2015    Past Surgical History:  Procedure Laterality Date   COLONOSCOPY WITH PROPOFOL N/A 04/23/2021   Procedure: COLONOSCOPY WITH PROPOFOL;  Surgeon: Midge Minium, MD;  Location: [facility];  Service: Endoscopy;  Laterality: N/A;   ESOPHAGOGASTRODUODENOSCOPY (EGD) WITH PROPOFOL N/A 04/23/2021   Procedure: ESOPHAGOGASTRODUODENOSCOPY (EGD) WITH PROPOFOL;  Surgeon: Midge Minium, MD;  Location: [facility];  Service: Endoscopy;  Laterality: N/A;   POLYPECTOMY  04/23/2021   Procedure: POLYPECTOMY;  Surgeon: Midge Minium, MD;  Location: [facility];  Service: Endoscopy;;    Family History  Problem Relation Age of Onset   Alcohol abuse Mother    Alcohol abuse Father    Drug abuse Father    Heart disease Maternal Grandfather     Social History   Socioeconomic History   Marital status: Divorced    Spouse name: Not on file   Number of children: 4   Years of education: Not on file   Highest education level: High school graduate  Occupational History   Occupation: Geologist, engineering    Employer: [employer]  Tobacco Use   Smoking status: Never   Smokeless tobacco: Former    Types: Snuff, Designer, multimedia Use   Vaping Use: Never used  Substance and Sexual Activity   Alcohol use: No    Alcohol/week: 0.0 standard drinks of alcohol   Drug use: No   Sexual activity: Not Currently  Other Topics Concern   Not on file  Social History Narrative   ** Merged History Encounter **       She was married for  21 years, has 4 children. Separated since 11/2017, struggling financially since, divorce final 05/20/2019    Social Determinants of Health   Financial Resource Strain: Low Risk  (07/15/2022)   Overall Financial Resource Strain (CARDIA)    Difficulty of Paying Living Expenses: Not hard at all  Food Insecurity: No Food Insecurity (02/22/2023)   Hunger Vital Sign    Worried About Running Out of Food in the Last Year: Never true    Ran Out of Food in the Last Year: Never true  Transportation  Needs: No Transportation Needs (02/22/2023)   PRAPARE - Administrator, Civil ServiceTransportation    Lack of Transportation (Medical): No    Lack of Transportation (Non-Medical): No  Physical Activity: Insufficiently Active (07/15/2022)   Exercise Vital Sign    Days of Exercise per Week: 1 day    Minutes of Exercise per Session: 10 min  Stress: No Stress Concern Present (07/15/2022)   [employer] - Occupational Stress Questionnaire    Feeling of Stress : Only a little  Social Connections: Moderately Integrated (07/15/2022)   Social Connection and Isolation Panel [NHANES]    Frequency of Communication with Friends and Family: Three times a week    Frequency of Social Gatherings with Friends and Family: Once a week    Attends Religious Services: More than 4 times per year    Active Member of [employer] Financial[employer]ubs or Organizations: Yes    Attends BankerClub or Organization Meetings: 1 to 4 times per year    Marital Status: Divorced  Intimate Partner Violence: Not At Risk (07/15/2022)   Humiliation, Afraid, Rape, and Kick questionnaire    Fear of Current or Ex-Partner: No    Emotionally Abused: No    Physically Abused: No    Sexually Abused: No     Current Outpatient Medications:    Azelastine-Fluticasone (DYMISTA) 137-50 MCG/ACT SUSP, Place 1 spray into the nose 2 (two) times daily., Disp: 23 g, Rfl: 2   Cholecalciferol (VITAMIN D) 2000 units CAPS, Take 1 capsule (2,000 Units total) by mouth daily., Disp: 30 capsule, Rfl: 0   cyclobenzaprine (FLEXERIL) 10 MG tablet, Take 1 tablet (10 mg total) by mouth at bedtime as needed for  muscle spasms., Disp: 30 tablet, Rfl: 0   hydrOXYzine (ATARAX/VISTARIL) 10 MG tablet, Take 1 tablet (10 mg total) by mouth 3 (three) times daily as needed., Disp: 90 tablet, Rfl: 0   lansoprazole (PREVACID) 30 MG capsule, Take 1 capsule (30 mg total) by mouth 2 (two) times daily before a meal., Disp: 60 capsule, Rfl: 2   levocetirizine (XYZAL) 5 MG tablet, Take 1 tablet (5 mg total) by mouth every evening., Disp: 30 tablet, Rfl: 2   levonorgestrel (MIRENA) 20 MCG/24HR IUD, 1 each by Intrauterine route., Disp: , Rfl:    linaclotide (LINZESS) 145 MCG CAPS capsule, Take 1 capsule (145 mcg total) by mouth daily before breakfast., Disp: 30 capsule, Rfl: 6   polyethylene glycol powder (GLYCOLAX/MIRALAX) 17 GM/SCOOP powder, Take 17 g by mouth daily., Disp: 3350 g, Rfl: 1   spironolactone (ALDACTONE) 25 MG tablet, Take 1 tablet (25 mg total) by mouth every morning., Disp: 30 tablet, Rfl: 1  Allergies  Allergen Reactions   Ozempic [Semaglutide]     headache     ROS  ***  Objective  There were no vitals filed for this visit.  There is no height or weight on file to calculate BMI.  Physical Exam ***  No results found for this or any previous visit (from the past 2160 hour(s)).   Fall Risk:    03/06/2023   11:50 AM 07/15/2022   11:50 AM 05/19/2022    1:10 PM 03/31/2022    8:14 AM 11/24/2021    9:26 AM  Fall Risk   Falls in the past year? 0 0 0 0 0  Number falls in past yr: 0 0  0 0  Injury with Fall? 0 0  0 0  Risk for fall due to :  No Fall Risks No Fall Risks No Fall Risks No  Fall Risks  Follow up  Falls prevention discussed Falls prevention discussed Falls prevention discussed Falls prevention discussed     Functional Status Survey:     Assessment & Plan  1. Well adult exam ***   -USPSTF grade A and B recommendations reviewed with patient; age-appropriate recommendations, preventive care, screening tests, etc discussed and encouraged; healthy living encouraged; see AVS for  patient education given to patient -Discussed importance of 150 minutes of physical activity weekly, eat two servings of fish weekly, eat one serving of tree nuts ( cashews, pistachios, pecans, almonds.Marland Kitchen) every other day, eat 6 servings of fruit/vegetables daily and drink plenty of water and avoid sweet beverages.   -Reviewed Health Maintenance: Yes.

## 2023-03-30 ENCOUNTER — Encounter: Payer: 59 | Admitting: Family Medicine

## 2023-03-30 DIAGNOSIS — Z0000 Encounter for general adult medical examination without abnormal findings: Secondary | ICD-10-CM

## 2023-06-07 NOTE — Progress Notes (Unsigned)
Name: Hannah Terry   MRN: [id number]    DOB: [birth year]   Date:06/08/2023       Progress Note  Subjective  Chief Complaint  Annual Exam  HPI  Patient presents for annual CPE.  Diet: she has been losing weight, she did a detox last week but avoiding red meat and drinking protein shakes  Exercise: she has been riding her bike and using her stepper 15 minutes three days a week   Last Eye Exam: due for an eye exam  Last Dental Exam: up to date   [employer] Visit from 06/08/2023 in [facility]  AUDIT-C Score 0      Depression: Phq 9 is  negative    06/08/2023    3:05 PM 03/06/2023   11:50 AM 07/15/2022   11:50 AM 05/19/2022    1:10 PM 03/31/2022    8:14 AM  Depression screen PHQ 2/9  Decreased Interest 0 0 0 0 0  Down, Depressed, Hopeless 0 0 0 0 0  PHQ - 2 Score 0 0 0 0 0  Altered sleeping 0 0 0 0 0  Tired, decreased energy 0 0 0 0 0  Change in appetite 0 0 0 0 0  Feeling bad or failure about yourself  0 0 0 0 0  Trouble concentrating 0 0 0 0 0  Moving slowly or fidgety/restless 0 0 0 0 0  Suicidal thoughts 0 0 0 0 0  PHQ-9 Score 0 0 0 0 0  Difficult doing work/chores  Not difficult at all      Hypertension: BP Readings from Last 3 Encounters:  06/08/23 122/68  03/06/23 122/78  02/19/23 117/70   Obesity: Wt Readings from Last 3 Encounters:  06/08/23 210 lb (95.3 kg)  03/06/23 217 lb 6.4 oz (98.6 kg)  02/19/23 215 lb (97.5 kg)   BMI Readings from Last 3 Encounters:  06/08/23 34.95 kg/m  03/06/23 36.18 kg/m  02/19/23 35.78 kg/m     Vaccines:   HPV: N/A Tdap: up to date Shingrix: discussed with patient eligible at 50  Pneumonia: up to date Flu: up to date COVID-19: N/A   Hep C Screening: 07/11/19 STD testing and prevention (HIV/chl/gon/syphilis): 02/10/21 Intimate partner violence: negative screen  Sexual History : new partner, no pain , has IUD  Menstrual History/LMP/Abnormal Bleeding:  Discussed importance of  follow up if any post-menopausal bleeding:N/A Incontinence Symptoms: she has some urgency but mild symptoms   Breast cancer:  - Last Mammogram: 10/03/18, due- ordered today - BRCA gene screening: N/A  Osteoporosis Prevention : Discussed high calcium and vitamin D supplementation, weight bearing exercises Bone density: N/A   Cervical cancer screening: 03/16/21  Skin cancer: Discussed monitoring for atypical lesions  Colorectal cancer: 04/23/21   Lung cancer:  Low Dose CT Chest recommended if Age 36-80 years, 20 pack-year currently smoking OR have quit w/in 15years. Patient does not qualify for screen   ECG: 07/11/19  Advanced Care Planning: A voluntary discussion about advance care planning including the explanation and discussion of advance directives.  Discussed health care proxy and Living will, and the patient was able to identify a health care proxy as daughter - Hannah Terry .  Patient does not have a living will and power of attorney of health care   Lipids: Lab Results  Component Value Date   CHOL 191 03/31/2022   CHOL 213 (H) 09/08/2021   CHOL 231 (H) 02/10/2021   Lab Results  Component Value  Date   HDL 41 (L) 03/31/2022   HDL 43 09/08/2021   HDL 47 (L) 02/10/2021   Lab Results  Component Value Date   LDLCALC 131 (H) 03/31/2022   LDLCALC 138 (H) 09/08/2021   LDLCALC 157 (H) 02/10/2021   Lab Results  Component Value Date   TRIG 93 03/31/2022   TRIG 158 (H) 09/08/2021   TRIG 145 02/10/2021   Lab Results  Component Value Date   CHOLHDL 4.7 03/31/2022   CHOLHDL 5.0 09/08/2021   CHOLHDL 4.9 02/10/2021   No results found for: "LDLDIRECT"  Glucose: Glucose, Bld  Date Value Ref Range Status  03/31/2022 89 65 - 99 mg/dL Final    Comment:    .            Fasting reference interval .   09/08/2021 144 (H) 70 - 99 mg/dL Final    Comment:    Glucose reference range applies only to samples taken after fasting for at least 8 hours.  02/10/2021 95 65 - 99 mg/dL  Final    Comment:    .            Fasting reference interval .     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   Obesity (BMI 30.0-34.9) 01/12/2016   MS (multiple sclerosis) (HCC) 01/12/2016   B12 deficiency 12/23/2015   IUD contraception 04/02/2015    Past Surgical History:  Procedure Laterality Date   COLONOSCOPY WITH PROPOFOL N/A 04/23/2021   Procedure: COLONOSCOPY WITH PROPOFOL;  Surgeon: Midge Minium, MD;  Location: [facility];  Service: Endoscopy;  Laterality: N/A;   ESOPHAGOGASTRODUODENOSCOPY (EGD) WITH PROPOFOL N/A 04/23/2021   Procedure: ESOPHAGOGASTRODUODENOSCOPY (EGD) WITH PROPOFOL;  Surgeon: Midge Minium, MD;  Location: [facility];  Service: Endoscopy;  Laterality: N/A;   POLYPECTOMY  04/23/2021   Procedure: POLYPECTOMY;  Surgeon: Midge Minium, MD;  Location: [facility];  Service: Endoscopy;;    Family History  Problem Relation Age of Onset   Alcohol abuse Mother    Alcohol abuse Father    Drug abuse Father    Heart disease Maternal Grandfather     Social History   Socioeconomic History   Marital status: Divorced    Spouse name: Not on file   Number of children: 4   Years of education: Not on file   Highest education level: High school graduate  Occupational History   Occupation: Geologist, engineering    Employer: [employer]  Tobacco Use   Smoking status: Never   Smokeless tobacco: Former    Types: Snuff, Designer, multimedia Use   Vaping Use: Never used  Substance and Sexual Activity   Alcohol use: No     Alcohol/week: 0.0 standard drinks of alcohol   Drug use: No   Sexual activity: Not Currently  Other Topics Concern   Not on file  Social History Narrative   ** Merged History Encounter **       She was married for  21 years, has 4 children. Separated since 11/2017, struggling financially since, divorce final 05/20/2019   Social Determinants of  Health   Financial Resource Strain: Low Risk  (06/08/2023)   Overall Financial Resource Strain (CARDIA)    Difficulty of Paying Living Expenses: Not hard at all  Food Insecurity: No Food Insecurity (02/22/2023)   Hunger Vital Sign    Worried About Running Out of Food in the Last Year: Never true    Ran Out of Food in the Last Year: Never true  Transportation Needs: No Transportation Needs (02/22/2023)   PRAPARE - Administrator, Civil Service (Medical): No    Lack of Transportation (Non-Medical): No  Physical Activity: Insufficiently Active (06/08/2023)   Exercise Vital Sign    Days of Exercise per Week: 3 days    Minutes of Exercise per Session: 20 min  Stress: Stress Concern Present (06/08/2023)   [employer] of Occupational Health - Occupational Stress Questionnaire    Feeling of Stress : To some extent  Social Connections: Moderately Integrated (06/08/2023)   Social Connection and Isolation Panel [NHANES]    Frequency of Communication with Friends and Family: More than three times a week    Frequency of Social Gatherings with Friends and Family: Once a week    Attends Religious Services: More than 4 times per year    Active Member of [employer] or Organizations: Yes    Attends Engineer, structural: More than 4 times per year    Marital Status: Divorced  Intimate Partner Violence: Not At Risk (06/08/2023)   Humiliation, Afraid, Rape, and Kick questionnaire    Fear of Current or Ex-Partner: No    Emotionally Abused: No    Physically Abused: No    Sexually Abused: No     Current Outpatient Medications:     Azelastine-Fluticasone (DYMISTA) 137-50 MCG/ACT SUSP, Place 1 spray into the nose 2 (two) times daily., Disp: 23 g, Rfl: 2   Cholecalciferol (VITAMIN D) 2000 units CAPS, Take 1 capsule (2,000 Units total) by mouth daily., Disp: 30 capsule, Rfl: 0   cyclobenzaprine (FLEXERIL) 10 MG tablet, Take 1 tablet (10 mg total) by mouth at bedtime as needed for muscle spasms., Disp: 30 tablet, Rfl: 0   hydrOXYzine (ATARAX/VISTARIL) 10 MG tablet, Take 1 tablet (10 mg total) by mouth 3 (three) times daily as needed., Disp: 90 tablet, Rfl: 0   lansoprazole (PREVACID) 30 MG capsule, Take 1 capsule (30 mg total) by mouth 2 (two) times daily before a meal., Disp: 60 capsule, Rfl: 2   levocetirizine (XYZAL) 5 MG tablet, Take 1 tablet (5 mg total) by mouth every evening., Disp: 30 tablet, Rfl: 2   levonorgestrel (MIRENA) 20 MCG/24HR IUD, 1 each by Intrauterine route., Disp: , Rfl:    linaclotide (LINZESS) 145 MCG CAPS capsule, Take 1 capsule (145 mcg total) by mouth daily before breakfast., Disp: 30 capsule, Rfl: 6   polyethylene glycol powder (GLYCOLAX/MIRALAX) 17 GM/SCOOP powder, Take 17 g by mouth daily., Disp: 3350 g, Rfl: 1   spironolactone (ALDACTONE) 25 MG tablet, Take 1 tablet (25 mg total) by mouth every morning., Disp: 30 tablet, Rfl: 1  Allergies  Allergen Reactions   Ozempic [Semaglutide]     headache     ROS  Constitutional: Negative for fever , positive for weight change.  Respiratory: Negative for cough and shortness of breath.   Cardiovascular: Negative for chest pain or palpitations.  Gastrointestinal: Negative for abdominal pain, no bowel changes.  Musculoskeletal: Negative for gait problem or joint swelling.  Skin: Negative for rash.  Neurological: Negative for dizziness or headache.  No  other specific complaints in a complete review of systems (except as listed in HPI above).   Objective  Vitals:   06/08/23 1505  BP: 122/68  Pulse: 94  Resp: 16  SpO2: 97%  Weight: 210 lb (95.3  kg)  Height: 5\' 5"  (1.651 m)    Body mass index is 34.95 kg/m.  Physical Exam  Constitutional: Patient appears well-developed and well-nourished.Obese  No distress.  HENT: Head: Normocephalic and atraumatic. Ears: B TMs ok, no erythema or effusion; Nose: Nose normal. Mouth/Throat: Oropharynx is clear and moist. No oropharyngeal exudate.  Eyes: Conjunctivae and EOM are normal. Pupils are equal, round, and reactive to light. No scleral icterus.  Neck: Normal range of motion. Neck supple. No JVD present. No thyromegaly present.  Cardiovascular: Normal rate, regular rhythm and normal heart sounds.  No murmur heard. No BLE edema. Pulmonary/Chest: Effort normal and breath sounds normal. No respiratory distress. Abdominal: Soft. Bowel sounds are normal, no distension. There is no tenderness. no masses Breast: no lumps or masses, no nipple discharge or rashes FEMALE GENITALIA:  External genitalia normal External urethra normal Vaginal vault normal without discharge or lesions Cervix normal without discharge or lesions IUD string not seen - she will contact [facility] and if needed she will call me back to get pelvic US  Bimanual exam normal without masses RECTAL:not done  Musculoskeletal: Normal range of motion, no joint effusions. No gross deformities Neurological: he is alert and oriented to person, place, and time. No cranial nerve deficit. Coordination, balance, strength, speech and gait are normal.  Skin: Skin is warm and dry. No rash noted. No erythema.  Psychiatric: Patient has a normal mood and affect. behavior is normal. Judgment and thought content normal.   Fall Risk:    06/08/2023    3:04 PM 03/06/2023   11:50 AM 07/15/2022   11:50 AM 05/19/2022    1:10 PM 03/31/2022    8:14 AM  Fall Risk   Falls in the past year? 0 0 0 0 0  Number falls in past yr: 0 0 0  0  Injury with Fall? 0 0 0  0  Risk for fall due to : No Fall Risks  No Fall Risks No Fall Risks No Fall Risks  Follow up Falls  prevention discussed  Falls prevention discussed Falls prevention discussed Falls prevention discussed     Functional Status Survey: Is the patient deaf or have difficulty hearing?: No Does the patient have difficulty seeing, even when wearing glasses/contacts?: No Does the patient have difficulty concentrating, remembering, or making decisions?: No Does the patient have difficulty walking or climbing stairs?: No Does the patient have difficulty dressing or bathing?: No Does the patient have difficulty doing errands alone such as visiting a doctor's office or shopping?: No   Assessment & Plan  1. Well adult exam  - Lipid panel - COMPLETE METABOLIC PANEL WITH GFR - CBC with Differential/Platelet - B12 and Folate Panel - Hemoglobin A1c - HIV Antibody (routine testing w rflx) - RPR - VITAMIN D 25 Hydroxy (Vit-D Deficiency, Fractures) - Cytology - PAP  2. Breast cancer screening by mammogram  - MM 3D SCREENING MAMMOGRAM BILATERAL BREAST; Future  3. B12 deficiency  - B12 and Folate Panel  4. Vitamin D deficiency   5. Dyslipidemia  - Lipid panel  6. Long-term use of high-risk medication  - COMPLETE METABOLIC PANEL WITH GFR - CBC with Differential/Platelet  7. Screening examination for STI  - HIV Antibody (routine testing w rflx) -  RPR - Cytology - PAP  8. Cervical cancer screening  - Cytology - PAP  9. Diabetes mellitus screening  - Hemoglobin A1c    -USPSTF grade A and B recommendations reviewed with patient; age-appropriate recommendations, preventive care, screening tests, etc discussed and encouraged; healthy living encouraged; see AVS for patient education given to patient -Discussed importance of 150 minutes of physical activity weekly, eat two servings of fish weekly, eat one serving of tree nuts ( cashews, pistachios, pecans, almonds.Marland Kitchen) every other day, eat 6 servings of fruit/vegetables daily and drink plenty of water and avoid sweet beverages.    -Reviewed Health Maintenance: Yes.

## 2023-06-08 ENCOUNTER — Encounter: Payer: Self-pay | Admitting: Family Medicine

## 2023-06-08 ENCOUNTER — Other Ambulatory Visit (HOSPITAL_COMMUNITY)
Admission: RE | Admit: 2023-06-08 | Discharge: 2023-06-08 | Disposition: A | Discharge status: Home / Self Care | Payer: 59 | Source: Ambulatory Visit | Attending: Family Medicine | Admitting: Family Medicine

## 2023-06-08 ENCOUNTER — Ambulatory Visit (INDEPENDENT_AMBULATORY_CARE_PROVIDER_SITE_OTHER): Payer: 59 | Admitting: Family Medicine

## 2023-06-08 VITALS — BP 122/68 | HR 94 | Resp 16 | Ht 65.0 in | Wt 210.0 lb

## 2023-06-08 DIAGNOSIS — Z0000 Encounter for general adult medical examination without abnormal findings: Secondary | ICD-10-CM

## 2023-06-08 DIAGNOSIS — Z1231 Encounter for screening mammogram for malignant neoplasm of breast: Secondary | ICD-10-CM | POA: Diagnosis not present

## 2023-06-08 DIAGNOSIS — Z124 Encounter for screening for malignant neoplasm of cervix: Secondary | ICD-10-CM | POA: Diagnosis not present

## 2023-06-08 DIAGNOSIS — Z79899 Other long term (current) drug therapy: Secondary | ICD-10-CM

## 2023-06-08 DIAGNOSIS — Z113 Encounter for screening for infections with a predominantly sexual mode of transmission: Secondary | ICD-10-CM | POA: Insufficient documentation

## 2023-06-08 DIAGNOSIS — E538 Deficiency of other specified B group vitamins: Secondary | ICD-10-CM

## 2023-06-08 DIAGNOSIS — E559 Vitamin D deficiency, unspecified: Secondary | ICD-10-CM

## 2023-06-08 DIAGNOSIS — Z131 Encounter for screening for diabetes mellitus: Secondary | ICD-10-CM | POA: Diagnosis not present

## 2023-06-08 DIAGNOSIS — E785 Hyperlipidemia, unspecified: Secondary | ICD-10-CM | POA: Diagnosis not present

## 2023-06-09 LAB — CBC WITH DIFFERENTIAL/PLATELET
Absolute Monocytes: 610 cells/uL (ref 200–950)
Basophils Absolute: 64 cells/uL (ref 0–200)
Basophils Relative: 0.7 %
Eosinophils Absolute: 137 cells/uL (ref 15–500)
Eosinophils Relative: 1.5 %
HCT: 40.5 % (ref 35.0–45.0)
Hemoglobin: 14.1 g/dL (ref 11.7–15.5)
Lymphs Abs: 3049 cells/uL (ref 850–3900)
MCH: 30.7 pg (ref 27.0–33.0)
MCHC: 34.8 g/dL (ref 32.0–36.0)
MCV: 88.2 fL (ref 80.0–100.0)
MPV: 10.6 fL (ref 7.5–12.5)
Monocytes Relative: 6.7 %
Neutro Abs: 5242 cells/uL (ref 1500–7800)
Neutrophils Relative %: 57.6 %
Platelets: 280 10*3/uL (ref 140–400)
RBC: 4.59 10*6/uL (ref 3.80–5.10)
RDW: 11.9 % (ref 11.0–15.0)
Total Lymphocyte: 33.5 %
WBC: 9.1 10*3/uL (ref 3.8–10.8)

## 2023-06-09 LAB — COMPLETE METABOLIC PANEL WITH GFR
AG Ratio: 1.4 (calc) (ref 1.0–2.5)
ALT: 15 U/L (ref 6–29)
AST: 15 U/L (ref 10–35)
Albumin: 4.1 g/dL (ref 3.6–5.1)
Alkaline phosphatase (APISO): 95 U/L (ref 31–125)
BUN: 9 mg/dL (ref 7–25)
CO2: 27 mmol/L (ref 20–32)
Calcium: 9.1 mg/dL (ref 8.6–10.2)
Chloride: 105 mmol/L (ref 98–110)
Creat: 0.82 mg/dL (ref 0.50–0.99)
Globulin: 2.9 g/dL (calc) (ref 1.9–3.7)
Glucose, Bld: 83 mg/dL (ref 65–99)
Potassium: 4.1 mmol/L (ref 3.5–5.3)
Sodium: 141 mmol/L (ref 135–146)
Total Bilirubin: 0.3 mg/dL (ref 0.2–1.2)
Total Protein: 7 g/dL (ref 6.1–8.1)
eGFR: 88 mL/min/{1.73_m2} (ref >=60)

## 2023-06-09 LAB — B12 AND FOLATE PANEL
Folate: 9.6 ng/mL
Vitamin B-12: 344 pg/mL (ref 200–1100)

## 2023-06-09 LAB — LIPID PANEL
Cholesterol: 187 mg/dL (ref <200)
HDL: 36 mg/dL — ABNORMAL LOW (ref >=50)
LDL Cholesterol (Calc): 115 mg/dL (calc) — ABNORMAL HIGH
Non-HDL Cholesterol (Calc): 151 mg/dL (calc) — ABNORMAL HIGH (ref <130)
Total CHOL/HDL Ratio: 5.2 (calc) — ABNORMAL HIGH (ref <5.0)
Triglycerides: 251 mg/dL — ABNORMAL HIGH (ref <150)

## 2023-06-09 LAB — HEMOGLOBIN A1C
Hgb A1c MFr Bld: 5.4 % of total Hgb (ref <5.7)
Mean Plasma Glucose: 108 mg/dL
eAG (mmol/L): 6 mmol/L

## 2023-06-09 LAB — VITAMIN D 25 HYDROXY (VIT D DEFICIENCY, FRACTURES): Vit D, 25-Hydroxy: 18 ng/mL — ABNORMAL LOW (ref 30–100)

## 2023-06-09 LAB — HIV ANTIBODY (ROUTINE TESTING W REFLEX): HIV 1&2 Ab, 4th Generation: NONREACTIVE

## 2023-06-09 LAB — RPR: RPR Ser Ql: NONREACTIVE

## 2023-07-17 ENCOUNTER — Encounter: Payer: Self-pay | Admitting: Family Medicine

## 2023-07-17 ENCOUNTER — Telehealth: Payer: Self-pay | Admitting: Family Medicine

## 2023-07-17 NOTE — Telephone Encounter (Signed)
Pt is calling to report that she is having server cramping. Reporting that her insurance does not cover [facility] health. Can IUD removal be added to pt's appt this week?

## 2023-07-17 NOTE — Telephone Encounter (Signed)
I called to inform pt that Dr Carlynn Purl does not remove IUDs. Pt states she's in a lot of pain and wanted Dr Carlynn Purl to go ahead and order a ultrasound to be sure the IUD is in the right place. Pt will also need a new OBGYN referral. [facility] is out of her network

## 2023-07-18 ENCOUNTER — Other Ambulatory Visit: Payer: Self-pay | Admitting: Family Medicine

## 2023-07-18 DIAGNOSIS — N920 Excessive and frequent menstruation with regular cycle: Secondary | ICD-10-CM

## 2023-07-18 DIAGNOSIS — R109 Unspecified abdominal pain: Secondary | ICD-10-CM

## 2023-07-18 NOTE — Telephone Encounter (Signed)
Pt called and stated her menstrual is the worst it has ever been. She called [facility] and they can get her in but they need a stat referral to do this for her. Pt has an appt on 07/20/23 for a 1 mth fu, IF she still needs it? Pt states she's in a lot of pain, cramping pain and needs to get the IUD removed and is also asking for a ultrasound to be ordered to [facility] . Pt is also sending a MyChart message

## 2023-07-19 ENCOUNTER — Encounter: Payer: 59 | Admitting: Family Medicine

## 2023-07-19 ENCOUNTER — Emergency Department: Payer: 59

## 2023-07-19 ENCOUNTER — Emergency Department
Admission: EM | Admit: 2023-07-19 | Discharge: 2023-07-20 | Disposition: A | Discharge status: Home / Self Care | Payer: 59 | Attending: Emergency Medicine | Admitting: Emergency Medicine

## 2023-07-19 ENCOUNTER — Other Ambulatory Visit: Payer: Self-pay

## 2023-07-19 DIAGNOSIS — N939 Abnormal uterine and vaginal bleeding, unspecified: Secondary | ICD-10-CM | POA: Diagnosis not present

## 2023-07-19 DIAGNOSIS — R102 Pelvic and perineal pain: Secondary | ICD-10-CM | POA: Diagnosis not present

## 2023-07-19 LAB — CBC
HCT: 40.8 % (ref 36.0–46.0)
Hemoglobin: 13.6 g/dL (ref 12.0–15.0)
MCH: 29.9 pg (ref 26.0–34.0)
MCHC: 33.3 g/dL (ref 30.0–36.0)
MCV: 89.7 fL (ref 80.0–100.0)
Platelets: 281 10*3/uL (ref 150–400)
RBC: 4.55 MIL/uL (ref 3.87–5.11)
RDW: 12.3 % (ref 11.5–15.5)
WBC: 8.9 10*3/uL (ref 4.0–10.5)
nRBC: 0 % (ref 0.0–0.2)

## 2023-07-19 LAB — URINALYSIS, ROUTINE W REFLEX MICROSCOPIC
Bacteria, UA: NONE SEEN
Bilirubin Urine: NEGATIVE
Glucose, UA: NEGATIVE mg/dL
Ketones, ur: NEGATIVE mg/dL
Leukocytes,Ua: NEGATIVE
Nitrite: NEGATIVE
Protein, ur: NEGATIVE mg/dL
RBC / HPF: >50 RBC/hpf (ref 0–5)
Specific Gravity, Urine: 1.017 (ref 1.005–1.030)
pH: 5 (ref 5.0–8.0)

## 2023-07-19 LAB — LIPASE, BLOOD: Lipase: 43 U/L (ref 11–51)

## 2023-07-19 LAB — COMPREHENSIVE METABOLIC PANEL
ALT: 16 U/L (ref 0–44)
AST: 19 U/L (ref 15–41)
Albumin: 4 g/dL (ref 3.5–5.0)
Alkaline Phosphatase: 84 U/L (ref 38–126)
Anion gap: 4 — ABNORMAL LOW (ref 5–15)
BUN: 12 mg/dL (ref 6–20)
CO2: 23 mmol/L (ref 22–32)
Calcium: 8.5 mg/dL — ABNORMAL LOW (ref 8.9–10.3)
Chloride: 108 mmol/L (ref 98–111)
Creatinine, Ser: 0.82 mg/dL (ref 0.44–1.00)
GFR, Estimated: >60 mL/min (ref >60)
Glucose, Bld: 162 mg/dL — ABNORMAL HIGH (ref 70–99)
Potassium: 3.3 mmol/L — ABNORMAL LOW (ref 3.5–5.1)
Sodium: 135 mmol/L (ref 135–145)
Total Bilirubin: 0.7 mg/dL (ref 0.3–1.2)
Total Protein: 7.9 g/dL (ref 6.5–8.1)

## 2023-07-19 LAB — POC URINE PREG, ED: Preg Test, Ur: NEGATIVE

## 2023-07-19 NOTE — Discharge Instructions (Addendum)
Your CT scan today was reassuring and your IUD seems to be in the appropriate position  Please call and set up close follow-up with OB/GYN for further evaluation of your IUD and symptoms.  Return to the ER right away if you develop severe abdominal pain, fever, vomiting, heavy vaginal bleeding such as bleeding more than 1 pad per hour, or other concerns or symptoms arise

## 2023-07-19 NOTE — ED Triage Notes (Addendum)
Pt to ed from home via POV for "pelvic pain". Pt is worried it might be related to her IUD in place. Pt is having sharp shooting pains in her entire pelvic area into her abdomen. Pt is caox4, in no acute distress and ambulatory in triage. Pt saw her PCP last week and they could not see her IUD strings. She has an order for an ultrasound to confirm placement of IUD.

## 2023-07-19 NOTE — ED Notes (Signed)
See triage note. Pain in abdomen started Sunday/Monday for pelvic pain. States first menstrual cycles in 18 years and thinks it is probably her IUD and wants it taken out and another form of BC.

## 2023-07-19 NOTE — ED Provider Notes (Signed)
[facility] Provider Note    Event Date/Time   First MD Initiated Contact with Patient 07/19/23 2049     (approximate)   History   Abdominal Pain (X 2 days)   HPI  Hannah Terry is a 52 y.o. female   history of multiple sclerosis, reflux esophagitis, obesity and IUD  Patient reports that for several days now she has been experiencing a pelvic pain.  She reports she started having menstrual-like cramping and bleeding over the last several days.  It is not heavy but uncomfortable and when it does occur it has a pelvic sharp radiating discomfort.  She called her primary care doctor and requested to have her IUD removed but was informed that they cannot provide this procedure.  She reports she had IUD placed at [facility].  She has had no vaginal discharge other than bleeding.  No fevers or chills.  No abdominal pain.  No nausea or vomiting no diarrhea      Physical Exam   Triage Vital Signs: ED Triage Vitals  Encounter Vitals Group     BP 07/19/23 1859 (!) 133/58     Systolic BP Percentile --      Diastolic BP Percentile --      Pulse Rate 07/19/23 1859 77     Resp 07/19/23 1859 16     Temp 07/19/23 1859 98.5 F (36.9 C)     Temp Source 07/19/23 1859 Oral     SpO2 07/19/23 1859 98 %     Weight --      Height 07/19/23 1900 5\' 5"  (1.651 m)     Head Circumference --      Peak Flow --      Pain Score 07/19/23 1859 8     Pain Loc --      Pain Education --      Exclude from Growth Chart --     Most recent vital signs: Vitals:   07/19/23 2300 07/19/23 2330  BP: 114/74 (!) 115/59  Pulse: (!) 49 63  Resp:    Temp:    SpO2: 99% 97%     General: Awake, no distress.  CV:  Good peripheral perfusion.  Resp:  Normal effort.  Abd:  No distention.  Abdomen soft nontender nondistended.  No noted tenderness in the suprapubic region or lower quadrants bilaterally. Other:  Warm well-perfused extremities   ED Results / Procedures / Treatments    Labs (all labs ordered are listed, but only abnormal results are displayed) Labs Reviewed  COMPREHENSIVE METABOLIC PANEL - Abnormal; Notable for the following components:      Result Value   Potassium 3.3 (*)    Glucose, Bld 162 (*)    Calcium 8.5 (*)    Anion gap 4 (*)    All other components within normal limits  URINALYSIS, ROUTINE W REFLEX MICROSCOPIC - Abnormal; Notable for the following components:   Color, Urine YELLOW (*)    APPearance CLEAR (*)    Hgb urine dipstick LARGE (*)    All other components within normal limits  LIPASE, BLOOD  CBC  POC URINE PREG, ED   Labs reviewed remarkable for normal CBC.  Comprehensive metabolic panel without acute concerning anomaly. RADIOLOGY  CT of the abdomen pelvis is pending at time of signout to Dr. Erma Heritage to follow-up  US PELVIS (TRANSABDOMINAL ONLY)  Result Date: 07/19/2023 CLINICAL DATA:  Pelvic pain. EXAM: TRANSABDOMINAL ULTRASOUND OF PELVIS TECHNIQUE: Transabdominal ultrasound examination of the pelvis was  performed including evaluation of the uterus, ovaries, adnexal regions, and pelvic cul-de-sac. COMPARISON:  None Available. FINDINGS: Uterus Measurements: 8.4 x 4.6 x 4.5 cm = volume: 103 mL. No fibroids or other mass visualized. Endometrium Thickness: 9 mm. The endometrium is poorly visualized and suboptimally evaluated. An intrauterine device is noted. Evaluation of the IUD is limited due to suboptimal visualization. The left arm of the IUD is noted in the fundal endometrium. The right arm of the IUD is poorly visualized. The cervical region is poorly visualized and suboptimally evaluated there is fluid in the endocervical region extending into the vagina. The cervix may be open but suboptimally evaluated. The patient refused transvaginal imaging. Further evaluation with transvaginal ultrasound, on a nonemergent/outpatient basis and when the patient is able to tolerate, is advised for better evaluation. Right ovary Not visualized.  Left ovary Not visualized. Other findings:  No abnormal free fluid. IMPRESSION: 1. Poorly visualized IUD. The left arm of the IUD appears in appropriate positioning. The right arm of the IUD is not visualized. 2. Open appearance of the cervical region with fluid within the endocervix extending into the vagina. Further evaluation with transvaginal imaging on a nonemergent/outpatient basis recommended. 3. Nonvisualization of the ovaries. Electronically Signed   By: Elgie Collard M.D.   On: 07/19/2023 21:43      PROCEDURES:  Critical Care performed: No  Procedures   MEDICATIONS ORDERED IN ED: Medications - No data to display   IMPRESSION / MDM / ASSESSMENT AND PLAN / ED COURSE  I reviewed the triage vital signs and the nursing notes.                              Differential diagnosis includes, but is not limited to, gynecologic causation such as dysfunctional uterine bleeding, IUD malpositioning, hemorrhagic cyst, fibroid, endometrial mass or lesion etc.  Patient denies acute associated intra-abdominal symptoms.  Patient had gynecologic screening done in June with her PCP.  She did notes at that point that her IUD strings were not visible and she has been having symptoms of intermittent lower abdominal crampiness and she informed her PCP that she wanted to have her IUD removed, but I PCP unable to do so.  Her examination today of the abdomen pelvis is quite reassuring.  She had recent pelvic examination, and today ultrasound demonstrates only partial visualization of her IUD and nonvisualization of the ovaries.  Discussed with patient we will proceed with CT to further evaluate.  She does not have any vaginal discharge fever or symptoms of be highly suggestive of pelvic infection, but rather her description seems to be of dysfunctional uterine bleeding, or other cause for bleeding and including potential IUD malposition etc.  Patient's presentation is most consistent with acute  complicated illness / injury requiring diagnostic workup.        ----------------------------------------- 11:59 PM on 07/19/2023 ----------------------------------------- Ongoing care and disposition assigned to Dr. Erma Heritage.  Follow-up on CT abdomen pelvis.  If negative for acute findings or acute concern, would recommend patient for discharge to follow-up with her gynecologist at [facility]  FINAL CLINICAL IMPRESSION(S) / ED DIAGNOSES   Final diagnoses:  Abnormal vaginal bleeding     Rx / DC Orders   ED Discharge Orders     None        Note:  This document was prepared using Dragon voice recognition software and may include unintentional dictation errors.   Sharyn Creamer, MD 07/22/23 [phone]

## 2023-07-19 NOTE — Progress Notes (Deleted)
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   Obesity (BMI 30.0-34.9) 01/12/2016   MS (multiple sclerosis) (HCC) 01/12/2016   B12 deficiency 12/23/2015   IUD contraception 04/02/2015    Past Surgical History:  Procedure Laterality Date   COLONOSCOPY WITH PROPOFOL N/A 04/23/2021   Procedure: COLONOSCOPY WITH PROPOFOL;  Surgeon: Midge Minium, MD;  Location: [facility];  Service: Endoscopy;  Laterality: N/A;   ESOPHAGOGASTRODUODENOSCOPY (EGD) WITH PROPOFOL N/A 04/23/2021   Procedure: ESOPHAGOGASTRODUODENOSCOPY (EGD) WITH PROPOFOL;  Surgeon: Midge Minium, MD;  Location: [facility];  Service: Endoscopy;  Laterality: N/A;   POLYPECTOMY  04/23/2021   Procedure: POLYPECTOMY;  Surgeon: Midge Minium, MD;  Location: [facility];  Service: Endoscopy;;    Family History  Problem Relation Age of Onset   Alcohol abuse Mother    Alcohol abuse Father    Drug abuse Father    Heart disease Maternal Grandfather     Social History   Tobacco Use   Smoking status: Never   Smokeless tobacco: Former    Types: Snuff, Chew  Substance Use Topics   Alcohol use: No    Alcohol/week: 0.0 standard drinks of alcohol     Current Outpatient Medications:    Azelastine-Fluticasone (DYMISTA) 137-50 MCG/ACT SUSP, Place 1 spray into the nose 2 (two) times daily., Disp: 23 g, Rfl: 2   Cholecalciferol (VITAMIN D) 2000  units CAPS, Take 1 capsule (2,000 Units total) by mouth daily., Disp: 30 capsule, Rfl: 0   cyclobenzaprine (FLEXERIL) 10 MG tablet, Take 1 tablet (10 mg total) by mouth at bedtime as needed for muscle spasms., Disp: 30 tablet, Rfl: 0   hydrOXYzine (ATARAX/VISTARIL) 10 MG tablet, Take 1 tablet (10 mg total) by mouth 3 (three) times daily as needed., Disp: 90 tablet, Rfl: 0   lansoprazole (PREVACID) 30 MG capsule, Take 1 capsule (30 mg total) by mouth 2 (two) times daily before a meal., Disp: 60 capsule, Rfl: 2   levocetirizine (XYZAL) 5 MG tablet, Take 1 tablet (5 mg total) by mouth every evening., Disp: 30 tablet, Rfl: 2   levonorgestrel (MIRENA) 20 MCG/24HR IUD, 1 each by Intrauterine route., Disp: , Rfl:    linaclotide (LINZESS) 145 MCG CAPS capsule, Take 1 capsule (145 mcg total) by mouth daily before breakfast., Disp: 30 capsule, Rfl: 6   polyethylene glycol powder (GLYCOLAX/MIRALAX) 17 GM/SCOOP powder, Take 17 g by mouth daily., Disp: 3350 g, Rfl: 1   spironolactone (ALDACTONE) 25 MG tablet, Take 1 tablet (25 mg total) by mouth every morning., Disp: 30 tablet, Rfl: 1  Allergies  Allergen Reactions   Ozempic [Semaglutide]     headache    I personally reviewed {Reviewed:14835} with the patient/caregiver today.   ROS  ***  Objective  There were no vitals filed for this visit.  There is no height or weight on file to calculate BMI.  Physical Exam ***  Recent Results (from the past 2160 hour(s))  Lipid panel     Status: Abnormal   Collection Time: 06/08/23  3:46 PM  Result Value Ref Range   Cholesterol 187 <200 mg/dL   HDL 36 (L) > OR = 50 mg/dL   Triglycerides 601 (H) <150 mg/dL    Comment: . If a non-fasting specimen was collected, consider repeat triglyceride testing on a fasting specimen if clinically indicated.  Perry Mount et al. J. of Clin. Lipidol. 2015;9:129-169. Marland Kitchen    LDL Cholesterol (Calc) 115 (H) mg/dL (calc)    Comment: Reference range: <100 . Desirable range  <100 mg/dL for primary prevention;   <70 mg/dL for patients with CHD or diabetic patients  with > or = 2 CHD risk factors. Marland Kitchen LDL-C is now calculated using the Martin-Hopkins  calculation, which is a validated novel method providing  better accuracy than the Friedewald equation in the  estimation of LDL-C.  Horald Pollen et al. Lenox Ahr. 0932;355(73): 2061-2068  (http://education.[facility].com/faq/FAQ164)    Total CHOL/HDL Ratio 5.2 (H) <5.0 (calc)   Non-HDL Cholesterol (Calc) 151 (H) <130 mg/dL (calc)    Comment: For patients with diabetes plus 1 major ASCVD risk  factor, treating to a non-HDL-C goal of <100 mg/dL  (LDL-C of <22 mg/dL) is considered a therapeutic  option.   COMPLETE METABOLIC PANEL WITH GFR     Status: None   Collection Time: 06/08/23  3:46 PM  Result Value Ref Range   Glucose, Bld 83 65 - 99 mg/dL    Comment: .            Fasting reference interval .    BUN 9 7 - 25 mg/dL   Creat 0.25 4.27 - 0.62 mg/dL   eGFR 88 > OR = 60 BJ/SEG/3.15V7   BUN/Creatinine Ratio SEE NOTE: 6 - 22 (calc)    Comment:    Not Reported: BUN and Creatinine are within    reference range. .    Sodium 141 135 - 146 mmol/L   Potassium 4.1 3.5 - 5.3 mmol/L   Chloride 105 98 - 110 mmol/L   CO2 27 20 - 32 mmol/L   Calcium 9.1 8.6 - 10.2 mg/dL   Total Protein 7.0 6.1 - 8.1 g/dL   Albumin 4.1 3.6 - 5.1 g/dL   Globulin 2.9 1.9 - 3.7 g/dL (calc)   AG Ratio 1.4 1.0 - 2.5 (calc)   Total Bilirubin 0.3 0.2 - 1.2 mg/dL   Alkaline phosphatase (APISO) 95 31 - 125 U/L   AST 15 10 - 35 U/L   ALT 15 6 - 29 U/L  CBC with Differential/Platelet     Status: None   Collection Time: 06/08/23  3:46 PM  Result Value Ref Range   WBC 9.1 3.8 - 10.8 Thousand/uL   RBC 4.59 3.80 - 5.10 Million/uL   Hemoglobin 14.1 11.7 - 15.5 g/dL   HCT 61.6 07.3 - 71.0 %   MCV 88.2 80.0 - 100.0 fL   MCH 30.7 27.0 - 33.0 pg   MCHC 34.8 32.0 - 36.0 g/dL   RDW 62.6 94.8 - 54.6 %   Platelets 280 140 - 400 Thousand/uL    MPV 10.6 7.5 - 12.5 fL   Neutro Abs 5,242 1,500 - 7,800 cells/uL   Lymphs Abs 3,049 850 - 3,900 cells/uL   Absolute Monocytes 610 200 - 950 cells/uL   Eosinophils Absolute 137 15 - 500 cells/uL   Basophils Absolute 64 0 - 200 cells/uL   Neutrophils Relative %  57.6 %   Total Lymphocyte 33.5 %   Monocytes Relative 6.7 %   Eosinophils Relative 1.5 %   Basophils Relative 0.7 %  B12 and Folate Panel     Status: None   Collection Time: 06/08/23  3:46 PM  Result Value Ref Range   Vitamin B-12 344 200 - 1,100 pg/mL    Comment: . Please Note: Although the reference range for vitamin B12 is 361 194 2070 pg/mL, it has been reported that between 5 and 10% of patients with values between 200 and 400 pg/mL may experience neuropsychiatric and hematologic abnormalities due to occult B12 deficiency; less than 1% of patients with values above 400 pg/mL will have symptoms. .    Folate 9.6 ng/mL    Comment:                            Reference Range                            Low:           <3.4                            Borderline:    3.4-5.4                            Normal:        >5.4 .   Hemoglobin A1c     Status: None   Collection Time: 06/08/23  3:46 PM  Result Value Ref Range   Hgb A1c MFr Bld 5.4 <5.7 % of total Hgb    Comment: For the purpose of screening for the presence of diabetes: . <5.7%       Consistent with the absence of diabetes 5.7-6.4%    Consistent with increased risk for diabetes             (prediabetes) > or =6.5%  Consistent with diabetes . This assay result is consistent with a decreased risk of diabetes. . Currently, no consensus exists regarding use of hemoglobin A1c for diagnosis of diabetes in children. . According to American Diabetes Association (ADA) guidelines, hemoglobin A1c <7.0% represents optimal control in non-pregnant diabetic patients. Different metrics may apply to specific patient populations.  Standards of Medical Care in Diabetes(ADA). .     Mean Plasma Glucose 108 mg/dL   eAG (mmol/L) 6.0 mmol/L    Comment: . This test was performed on the Roche cobas c503 platform. Effective 09/26/22, a change in test platforms from the Abbott Architect to the Roche cobas c503 may have shifted HbA1c results compared to historical results. Based on laboratory validation testing conducted at [facility], the Roche platform relative to the Abbott platform had an average increase in HbA1c value of < or = 0.3%. This difference is within accepted  variability established by the [facility]. Note that not all individuals will have had a shift in their results and direct comparisons between historical and current results for testing conducted on different platforms is not recommended.   HIV Antibody (routine testing w rflx)     Status: None   Collection Time: 06/08/23  3:46 PM  Result Value Ref Range   HIV 1&2 Ab, 4th Generation NON-REACTIVE NON-REACTIVE    Comment: HIV-1 antigen and HIV-1/HIV-2 antibodies were not detected. There is no laboratory  evidence of HIV infection. Marland Kitchen PLEASE NOTE: This information has been disclosed to you from records whose confidentiality may be protected by state law.  If your state requires such protection, then the state law prohibits you from making any further disclosure of the information without the specific written consent of the person to whom it pertains, or as otherwise permitted by law. A general authorization for the release of medical or other information is NOT sufficient for this purpose. . For additional information please refer to http://education.[facility].com/faq/FAQ106 (This link is being provided for informational/ educational purposes only.) . Marland Kitchen The performance of this assay has not been clinically validated in patients less than 2 years old. .   RPR     Status: None   Collection Time: 06/08/23  3:46 PM  Result Value Ref Range   RPR Ser Ql  NON-REACTIVE NON-REACTIVE    Comment: . No laboratory evidence of syphilis. If recent exposure is suspected, submit a new sample in 2-4 weeks. Marland Kitchen   VITAMIN D 25 Hydroxy (Vit-D Deficiency, Fractures)     Status: Abnormal   Collection Time: 06/08/23  3:46 PM  Result Value Ref Range   Vit D, 25-Hydroxy 18 (L) 30 - 100 ng/mL    Comment: Vitamin D Status         25-OH Vitamin D: . Deficiency:                    <20 ng/mL Insufficiency:             20 - 29 ng/mL Optimal:                 > or = 30 ng/mL . For 25-OH Vitamin D testing on patients on  D2-supplementation and patients for whom quantitation  of D2 and D3 fractions is required, the [facility]ure[facility](TM) 25-OH VIT D, (D2,D3), LC/MS/MS is recommended: order  code 40981 (patients >60yrs). . See Note 1 . Note 1 . For additional information, please refer to  http://education.[facility].com/faq/FAQ199  (This link is being provided for informational/ educational purposes only.)     Diabetic Foot Exam: Diabetic Foot Exam - Simple   No data filed    ***  PHQ2/9:    06/08/2023    3:05 PM 03/06/2023   11:50 AM 07/15/2022   11:50 AM 05/19/2022    1:10 PM 03/31/2022    8:14 AM  Depression screen PHQ 2/9  Decreased Interest 0 0 0 0 0  Down, Depressed, Hopeless 0 0 0 0 0  PHQ - 2 Score 0 0 0 0 0  Altered sleeping 0 0 0 0 0  Tired, decreased energy 0 0 0 0 0  Change in appetite 0 0 0 0 0  Feeling bad or failure about yourself  0 0 0 0 0  Trouble concentrating 0 0 0 0 0  Moving slowly or fidgety/restless 0 0 0 0 0  Suicidal thoughts 0 0 0 0 0  PHQ-9 Score 0 0 0 0 0  Difficult doing work/chores  Not difficult at all       phq 9 is {gen pos [id number]} ***  Fall Risk:    06/08/2023    3:04 PM 03/06/2023   11:50 AM 07/15/2022   11:50 AM 05/19/2022    1:10 PM 03/31/2022    8:14 AM  Fall Risk   Falls in the past year? 0 0 0 0 0  Number falls in past yr: 0 0 0  0  Injury with  Fall? 0 0 0  0  Risk for fall due to : No Fall  Risks  No Fall Risks No Fall Risks No Fall Risks  Follow up Falls prevention discussed  Falls prevention discussed Falls prevention discussed Falls prevention discussed   ***   Functional Status Survey:   ***   Assessment & Plan  *** There are no diagnoses linked to this encounter.

## 2023-07-20 ENCOUNTER — Emergency Department: Payer: 59

## 2023-07-20 ENCOUNTER — Ambulatory Visit: Payer: 59 | Admitting: Family Medicine

## 2023-07-20 DIAGNOSIS — R102 Pelvic and perineal pain: Secondary | ICD-10-CM | POA: Diagnosis not present

## 2023-07-20 MED ORDER — IOHEXOL 300 MG/ML  SOLN
100.0000 mL | Freq: Once | INTRAMUSCULAR | Status: AC | PRN
  Administered 2023-07-20: 100 mL via INTRAVENOUS

## 2023-07-20 NOTE — ED Notes (Signed)
Pt refused CT scan and asking to be D/C. Pt sts will follow up with obgyn to have IUD removed.

## 2023-07-20 NOTE — ED Provider Notes (Addendum)
Menstrual like bleeding for past week Has IUD in place, couldn't find strings on an exam 1 week ago Sharp pelvic pains and vaginal bleeding TVUS shows 1 limb of IUD and unable to visualize ovaries CT Pending  While awaiting CT scan, pt states she needed to go, feels no pain, and is comfortable following up with ob. She has no other complaints. Given duration of sx and stable vitals, pt signed out AMA and will return as needed.   ADDENDUM: After discussion pt amenable to CT. CT Obtained, shows IUD In position with no other acute abnormality. F/u with OB.   Shaune Pollack, MD 07/20/23 Lars Pinks    Shaune Pollack, MD 07/20/23 [phone]

## 2023-07-21 ENCOUNTER — Ambulatory Visit: Payer: 59 | Admitting: Family Medicine

## 2023-07-24 ENCOUNTER — Other Ambulatory Visit: Payer: Self-pay | Admitting: Family Medicine

## 2023-07-24 DIAGNOSIS — R9389 Abnormal findings on diagnostic imaging of other specified body structures: Secondary | ICD-10-CM

## 2023-07-24 DIAGNOSIS — N63 Unspecified lump in unspecified breast: Secondary | ICD-10-CM

## 2023-07-25 DIAGNOSIS — Z30013 Encounter for initial prescription of injectable contraceptive: Secondary | ICD-10-CM | POA: Diagnosis not present

## 2023-07-25 DIAGNOSIS — Z30432 Encounter for removal of intrauterine contraceptive device: Secondary | ICD-10-CM | POA: Diagnosis not present

## 2023-07-25 DIAGNOSIS — N951 Menopausal and female climacteric states: Secondary | ICD-10-CM | POA: Diagnosis not present

## 2023-08-03 ENCOUNTER — Encounter: Payer: Self-pay | Admitting: Radiology

## 2023-08-03 ENCOUNTER — Ambulatory Visit
Admission: RE | Admit: 2023-08-03 | Discharge: 2023-08-03 | Disposition: A | Discharge status: Home / Self Care | Payer: 59 | Source: Ambulatory Visit | Attending: Family Medicine | Admitting: Family Medicine

## 2023-08-03 DIAGNOSIS — N63 Unspecified lump in unspecified breast: Secondary | ICD-10-CM | POA: Diagnosis not present

## 2023-08-03 DIAGNOSIS — N6002 Solitary cyst of left breast: Secondary | ICD-10-CM | POA: Diagnosis not present

## 2023-08-03 DIAGNOSIS — R9389 Abnormal findings on diagnostic imaging of other specified body structures: Secondary | ICD-10-CM

## 2023-08-03 DIAGNOSIS — N6320 Unspecified lump in the left breast, unspecified quadrant: Secondary | ICD-10-CM | POA: Diagnosis not present

## 2023-08-03 DIAGNOSIS — R92333 Mammographic heterogeneous density, bilateral breasts: Secondary | ICD-10-CM | POA: Diagnosis not present

## 2023-09-04 ENCOUNTER — Other Ambulatory Visit: Payer: Self-pay | Admitting: Family Medicine

## 2023-09-04 DIAGNOSIS — J302 Other seasonal allergic rhinitis: Secondary | ICD-10-CM

## 2023-09-04 NOTE — Telephone Encounter (Signed)
Medication Refill - Medication: Azelastine-Fluticasone (DYMISTA) 137-50 MCG/ACT SUSP   Has the patient contacted their pharmacy? Yes.   Pt thought this was current, but it has been over 2 yrs since she had this medication.  Preferred Pharmacy (with phone number or street name): [id number] - [place] [address] [place] - [address]  Has the patient been seen for an appointment in the last year OR does the patient have an upcoming appointment? Yes.    Agent: Please be advised that RX refills may take up to 3 business days. We ask that you follow-up with your pharmacy.

## 2023-09-05 NOTE — Telephone Encounter (Signed)
Requested medication (s) are due for refill today - expired Rx  Requested medication (s) are on the active medication list -yes  Future visit scheduled -yes  Last refill: 02/10/21 23g 2RF  Notes to clinic: expired Rx-sent for review   Requested Prescriptions  Pending Prescriptions Disp Refills   Azelastine-Fluticasone (DYMISTA) 137-50 MCG/ACT SUSP 23 g 2    Sig: Place 1 spray into the nose 2 (two) times daily.     Ear, Nose, and Throat: Nasal Preparations - Corticosteroids Passed - 09/04/2023  2:37 PM      Passed - Valid encounter within last 12 months    Recent Outpatient Visits           2 months ago Well adult exam   [facility] Alba Cory, MD   6 months ago Sprain of tibiofibular ligament of left ankle, subsequent encounter   [facility] Margarita Mail, DO   1 year ago MS (multiple sclerosis) [facility])   [place] [facility] Alba Cory, MD   1 year ago Dizziness   [facility] Mecum, Oswaldo Conroy, PA-C   1 year ago Vaginal discharge   [facility] Berniece Salines, FNP       Future Appointments             In 9 months Alba Cory, MD [facility]               Requested Prescriptions  Pending Prescriptions Disp Refills   Azelastine-Fluticasone (DYMISTA) 137-50 MCG/ACT SUSP 23 g 2    Sig: Place 1 spray into the nose 2 (two) times daily.     Ear, Nose, and Throat: Nasal Preparations - Corticosteroids Passed - 09/04/2023  2:37 PM      Passed - Valid encounter within last 12 months    Recent Outpatient Visits           2 months ago Well adult exam   [facility] Alba Cory, MD   6 months ago Sprain of tibiofibular ligament of left ankle, subsequent encounter   [facility] Margarita Mail, DO   1 year ago MS (multiple sclerosis) [facility])    [place] Alba Cory, MD   1 year ago Dizziness   [facility] Mecum, Oswaldo Conroy, PA-C   1 year ago Vaginal discharge   [facility] Berniece Salines, FNP       Future Appointments             In 9 months Alba Cory, MD [facility]

## 2023-09-06 MED ORDER — AZELASTINE-FLUTICASONE 137-50 MCG/ACT NA SUSP
1.0000 | Freq: Two times a day (BID) | NASAL | 2 refills | Status: AC
Start: 2023-09-06 — End: ?

## 2023-10-03 ENCOUNTER — Ambulatory Visit (INDEPENDENT_AMBULATORY_CARE_PROVIDER_SITE_OTHER): Payer: 59 | Admitting: Family Medicine

## 2023-10-03 ENCOUNTER — Encounter: Payer: Self-pay | Admitting: Family Medicine

## 2023-10-03 VITALS — BP 120/82 | HR 81 | Resp 16 | Ht 65.0 in | Wt 200.0 lb

## 2023-10-03 DIAGNOSIS — N6321 Unspecified lump in the left breast, upper outer quadrant: Secondary | ICD-10-CM

## 2023-10-03 MED ORDER — DOXYCYCLINE HYCLATE 100 MG PO TABS
100.0000 mg | ORAL_TABLET | Freq: Two times a day (BID) | ORAL | 0 refills | Status: AC
Start: 2023-10-03 — End: 2023-10-10

## 2023-10-03 NOTE — Progress Notes (Addendum)
Patient ID: Hannah Terry, female    DOB: 07-18-1971, 52 y.o.   MRN: [id number]  PCP: Alba Cory, MD  Chief Complaint  Patient presents with   Breast Mass    Left, upper outer quadrant. Noticed this morning. Painful    Subjective:   Hannah Terry is a 52 y.o. female, presents to clinic with CC of the following:  HPI  Left breast pain she noticed in the past day- she felt a larger mass to left breast than what was previously evaluated 8/1 and 8/15 and found to have roughly 2x2x1 benign cyst in left breast No skin changes, redness, nipple discharge, or lymphadenopathy No fever sweats chills   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   Obesity (BMI 30.0-34.9) 01/12/2016   MS (multiple sclerosis) (HCC) 01/12/2016   B12 deficiency 12/23/2015   IUD contraception 04/02/2015      Current Outpatient Medications:    Azelastine-Fluticasone (DYMISTA) 137-50 MCG/ACT SUSP, Place 1 spray into the nose 2 (two) times daily., Disp: 23 g, Rfl: 2   Cholecalciferol (VITAMIN D) 2000 units CAPS, Take 1 capsule (2,000 Units total) by mouth daily., Disp: 30 capsule, Rfl: 0   cyclobenzaprine (FLEXERIL) 10 MG tablet, Take 1 tablet (10 mg total) by mouth at bedtime as needed for muscle spasms., Disp: 30 tablet, Rfl: 0   hydrOXYzine (ATARAX/VISTARIL) 10 MG tablet, Take 1 tablet (10 mg total) by mouth 3 (three) times daily as needed., Disp: 90 tablet, Rfl: 0   lansoprazole (PREVACID) 30 MG capsule, Take 1 capsule (30 mg total) by mouth 2 (two) times daily before a meal.,  Disp: 60 capsule, Rfl: 2   levocetirizine (XYZAL) 5 MG tablet, Take 1 tablet (5 mg total) by mouth every evening., Disp: 30 tablet, Rfl: 2   levonorgestrel (MIRENA) 20 MCG/24HR IUD, 1 each by Intrauterine route., Disp: , Rfl:    linaclotide (LINZESS) 145 MCG CAPS capsule, Take 1 capsule (145 mcg total) by mouth daily before breakfast., Disp: 30 capsule, Rfl: 6   polyethylene glycol powder (GLYCOLAX/MIRALAX) 17 GM/SCOOP powder, Take 17 g by mouth daily., Disp: 3350 g, Rfl: 1   spironolactone (ALDACTONE) 25 MG tablet, Take 1 tablet (25 mg total) by mouth every morning., Disp: 30 tablet, Rfl: 1   Allergies  Allergen Reactions   Ozempic [Semaglutide]     headache     Social History   Tobacco Use   Smoking status: Never   Smokeless tobacco: Former    Types: Snuff, Chew  Vaping Use   Vaping status: Never Used  Substance Use Topics   Alcohol use: No    Alcohol/week: 0.0 standard drinks of alcohol   Drug use: No      Chart Review Today: I personally reviewed active problem list, medication list, allergies, family history, social history, health maintenance, notes from last encounter, lab results, imaging with the patient/caregiver today.   Review of Systems  Constitutional: Negative.   HENT: Negative.    Eyes: Negative.   Respiratory: Negative.    Cardiovascular: Negative.   Gastrointestinal: Negative.   Endocrine: Negative.   Genitourinary:  Negative.   Musculoskeletal: Negative.   Skin: Negative.   Allergic/Immunologic: Negative.   Neurological: Negative.   Hematological: Negative.   Psychiatric/Behavioral: Negative.    All other systems reviewed and are negative.      Objective:   Vitals:   10/03/23 0900  BP: 120/82  Pulse: 81  Resp: 16  SpO2: 98%  Weight: 200 lb (90.7 kg)  Height: 5\' 5"  (1.651 m)    Body mass index is 33.28 kg/m.  Physical Exam Vitals and nursing note reviewed.  Constitutional:      General: She is not in acute distress.     Appearance: Normal appearance. She is well-developed. She is obese. She is not ill-appearing, toxic-appearing or diaphoretic.  HENT:     Head: Normocephalic and atraumatic.     Nose: Nose normal.  Eyes:     General:        Right eye: No discharge.        Left eye: No discharge.     Conjunctiva/sclera: Conjunctivae normal.  Neck:     Trachea: No tracheal deviation.  Cardiovascular:     Rate and Rhythm: Normal rate and regular rhythm.     Pulses: Normal pulses.     Heart sounds: Normal heart sounds.  Pulmonary:     Effort: Pulmonary effort is normal. No respiratory distress.     Breath sounds: Normal breath sounds. No stridor. No wheezing, rhonchi or rales.  Chest:  Breasts:    Left: Mass and tenderness present. No bleeding, nipple discharge or skin change.  Lymphadenopathy:     Upper Body:     Left upper body: No supraclavicular, axillary or pectoral adenopathy.  Skin:    General: Skin is warm and dry.     Findings: No rash.  Neurological:     Mental Status: She is alert.     Motor: No abnormal muscle tone.     Coordination: Coordination normal.  Psychiatric:        Mood and Affect: Mood normal.        Behavior: Behavior normal.      Results for orders placed or performed during the hospital encounter of 07/19/23  Lipase, blood  Result Value Ref Range   Lipase 43 11 - 51 U/L  Comprehensive metabolic panel  Result Value Ref Range   Sodium 135 135 - 145 mmol/L   Potassium 3.3 (L) 3.5 - 5.1 mmol/L   Chloride 108 98 - 111 mmol/L   CO2 23 22 - 32 mmol/L   Glucose, Bld 162 (H) 70 - 99 mg/dL   BUN 12 6 - 20 mg/dL   Creatinine, Ser 1.61 0.44 - 1.00 mg/dL   Calcium 8.5 (L) 8.9 - 10.3 mg/dL   Total Protein 7.9 6.5 - 8.1 g/dL   Albumin 4.0 3.5 - 5.0 g/dL   AST 19 15 - 41 U/L   ALT 16 0 - 44 U/L   Alkaline Phosphatase 84 38 - 126 U/L   Total Bilirubin 0.7 0.3 - 1.2 mg/dL   GFR, Estimated >09 >60 mL/min   Anion gap 4 (L) 5 - 15  CBC  Result Value Ref Range   WBC 8.9 4.0  - 10.5 K/uL   RBC 4.55 3.87 - 5.11 MIL/uL   Hemoglobin 13.6 12.0 - 15.0 g/dL   HCT 45.4 09.8 - 11.9 %   MCV 89.7 80.0 - 100.0 fL   MCH 29.9 26.0 - 34.0 pg   MCHC 33.3 30.0 - 36.0 g/dL   RDW  12.3 11.5 - 15.5 %   Platelets 281 150 - 400 K/uL   nRBC 0.0 0.0 - 0.2 %  Urinalysis, Routine w reflex microscopic -Urine, Clean Catch  Result Value Ref Range   Color, Urine YELLOW (A) YELLOW   APPearance CLEAR (A) CLEAR   Specific Gravity, Urine 1.017 1.005 - 1.030   pH 5.0 5.0 - 8.0   Glucose, UA NEGATIVE NEGATIVE mg/dL   Hgb urine dipstick LARGE (A) NEGATIVE   Bilirubin Urine NEGATIVE NEGATIVE   Ketones, ur NEGATIVE NEGATIVE mg/dL   Protein, ur NEGATIVE NEGATIVE mg/dL   Nitrite NEGATIVE NEGATIVE   Leukocytes,Ua NEGATIVE NEGATIVE   RBC / HPF >50 0 - 5 RBC/hpf   WBC, UA 6-10 0 - 5 WBC/hpf   Bacteria, UA NONE SEEN NONE SEEN   Squamous Epithelial / HPF 0-5 0 - 5 /HPF   Mucus PRESENT   POC urine preg, ED  Result Value Ref Range   Preg Test, Ur Negative Negative       Assessment & Plan:   1. Mass of upper outer quadrant of left breast Recently found/dx left breast cyst - noted to be benign Pain x 1 day, pt palpated the area and it feels much larger and it is severely tender,  prior dimentions ~2x2x1- feels greater than 4 cm diameter, deep in breast tissue, no superficial skin change (no overlying edema, induration, erythema) We have tried to arrange stat imaging today - but first available is Friday Will additionally put in a stat surgical referral If any erythema on skin or warmth begins would cover with abx - today we will hold on that.  - [place] LIMITED ULTRASOUND INCLUDING AXILLA LEFT BREAST  - MM 3D DIAGNOSTIC MAMMOGRAM UNILATERAL LEFT BREAST; Future  - Ambulatory referral to General Surgery Abx on hold and pt can start if it gets more inflammed, painful or there are skin changes - if needed hoping it would also help with inflammation - I do think she will need some kind of procedure  (I&D) - doxycycline (VIBRA-TABS) 100 MG tablet; Take 1 tablet (100 mg total) by mouth 2 (two) times daily for 7 days.  Dispense: 14 tablet; Refill: 0   Pt updated on plan CMA- Shae has called and talked to [place] to help arrange orders and scheduling      Danelle Berry, PA-C 10/03/23 9:12 AM

## 2023-10-03 NOTE — Addendum Note (Signed)
Addended by: Danelle Berry on: 10/03/2023 09:34 AM   Modules accepted: Orders

## 2023-10-13 ENCOUNTER — Other Ambulatory Visit: Payer: 59

## 2023-10-13 ENCOUNTER — Inpatient Hospital Stay: Admission: RE | Admit: 2023-10-13 | Payer: 59 | Source: Ambulatory Visit

## 2023-10-16 ENCOUNTER — Ambulatory Visit: Payer: 59 | Admitting: Surgery

## 2023-10-23 ENCOUNTER — Other Ambulatory Visit: Payer: 59

## 2023-10-23 ENCOUNTER — Inpatient Hospital Stay: Admission: RE | Admit: 2023-10-23 | Payer: 59 | Source: Ambulatory Visit

## 2023-10-25 ENCOUNTER — Ambulatory Visit: Payer: 59 | Admitting: Surgery

## 2023-10-30 DIAGNOSIS — R61 Generalized hyperhidrosis: Secondary | ICD-10-CM | POA: Diagnosis not present

## 2023-10-30 DIAGNOSIS — R232 Flushing: Secondary | ICD-10-CM | POA: Diagnosis not present

## 2023-10-30 DIAGNOSIS — N951 Menopausal and female climacteric states: Secondary | ICD-10-CM | POA: Diagnosis not present

## 2023-11-02 ENCOUNTER — Ambulatory Visit
Admission: RE | Admit: 2023-11-02 | Discharge: 2023-11-02 | Disposition: A | Discharge status: Home / Self Care | Payer: 59 | Source: Ambulatory Visit | Attending: Family Medicine | Admitting: Family Medicine

## 2023-11-02 ENCOUNTER — Telehealth: Payer: Self-pay

## 2023-11-02 DIAGNOSIS — N6321 Unspecified lump in the left breast, upper outer quadrant: Secondary | ICD-10-CM | POA: Insufficient documentation

## 2023-11-02 DIAGNOSIS — N644 Mastodynia: Secondary | ICD-10-CM | POA: Diagnosis not present

## 2023-11-02 DIAGNOSIS — N6002 Solitary cyst of left breast: Secondary | ICD-10-CM | POA: Diagnosis not present

## 2023-11-02 DIAGNOSIS — R92333 Mammographic heterogeneous density, bilateral breasts: Secondary | ICD-10-CM | POA: Diagnosis not present

## 2023-11-02 NOTE — Telephone Encounter (Signed)
Pt wants to know when she colonoscopy is due? Pt also wants to know the size of her  hernia that was found.

## 2023-11-02 NOTE — Telephone Encounter (Signed)
Per pt need more sample of  Linzess. She was transferred here and told to ask if we could get it approved so she could pick them up in [place]. Pt need middle strength.

## 2023-11-02 NOTE — Telephone Encounter (Signed)
Pt has not had OV since 2022, pt will need an OV Mychart video is okay. We also do not have samples at this time... I can send in a refill to last until appt

## 2023-11-03 ENCOUNTER — Other Ambulatory Visit: Payer: Self-pay | Admitting: Family Medicine

## 2023-11-03 DIAGNOSIS — N644 Mastodynia: Secondary | ICD-10-CM

## 2023-11-03 DIAGNOSIS — N6002 Solitary cyst of left breast: Secondary | ICD-10-CM

## 2023-11-07 ENCOUNTER — Other Ambulatory Visit: Payer: 59

## 2023-11-20 ENCOUNTER — Ambulatory Visit: Payer: 59 | Admitting: Surgery

## 2023-11-30 NOTE — Telephone Encounter (Signed)
Mammogram was ordered by Danelle Berry, PA. It does not look like provider reviewed it. Provider that ordered might have in her in basket the future orders the patient is requesting to be signed

## 2023-11-30 NOTE — Telephone Encounter (Signed)
Order retrieved from [facility] and sent to [facility] to sign

## 2023-11-30 NOTE — Telephone Encounter (Signed)
Copied from CRM [id number]. Topic: General - Other >> Nov 30, 2023  3:17 PM Hannah Terry wrote: Reason for CRM: Pt requesting order from [facility] to be signed by provider. Pt needing order to be signed in order to get surgery to remove cyst from breast. Pt requesting this be done as soon as possible.

## 2023-12-06 ENCOUNTER — Ambulatory Visit
Admission: RE | Admit: 2023-12-06 | Discharge: 2023-12-06 | Disposition: A | Discharge status: Home / Self Care | Payer: 59 | Source: Ambulatory Visit | Attending: Physician Assistant | Admitting: Physician Assistant

## 2023-12-06 DIAGNOSIS — N6002 Solitary cyst of left breast: Secondary | ICD-10-CM | POA: Diagnosis not present

## 2023-12-06 DIAGNOSIS — N644 Mastodynia: Secondary | ICD-10-CM | POA: Diagnosis not present

## 2023-12-06 MED ORDER — LIDOCAINE HCL (PF) 1 % IJ SOLN
5.0000 mL | Freq: Once | INTRAMUSCULAR | Status: AC
  Administered 2023-12-06: 5 mL via INTRADERMAL
  Filled 2023-12-06: qty 5

## 2023-12-08 NOTE — Progress Notes (Signed)
The ultrasound-guided aspiration of your left breast cyst appeared to have normal looking fluid.  It is recommended that you continue to have regular annual screening mammograms at this time.  Please let us know if you have further questions or concerns

## 2023-12-19 ENCOUNTER — Telehealth: Payer: 59 | Admitting: Gastroenterology

## 2023-12-19 ENCOUNTER — Telehealth (INDEPENDENT_AMBULATORY_CARE_PROVIDER_SITE_OTHER): Payer: Self-pay | Admitting: Gastroenterology

## 2023-12-19 DIAGNOSIS — K2100 Gastro-esophageal reflux disease with esophagitis, without bleeding: Secondary | ICD-10-CM

## 2023-12-19 DIAGNOSIS — K5900 Constipation, unspecified: Secondary | ICD-10-CM

## 2023-12-19 MED ORDER — LINACLOTIDE 145 MCG PO CAPS: 145.0000 ug | ORAL_CAPSULE | Freq: Every day | ORAL | 3 refills | Status: AC

## 2023-12-19 NOTE — Progress Notes (Signed)
 Hannah Copping, MD [address]  [address]  [place], [place] [address]  Main: [phone]  Fax: [phone]    Gastroenterology Virtual/Video Visit  Referring Provider:     Sowles, Krichna, MD Primary Care Physician:  Sowles, Krichna, MD Primary Gastroenterologist:  Dr.Tasnim Balentine Terry Reason for Consultation:     Follow-up        HPI:    Virtual Visit via Video Note Location of the patient: Work Location of provider: Home Office Participating persons: The patient and myself.  I connected with Hannah Terry on 12/19/23 at  3:15 PM EST by a video enabled telemedicine application and verified that I am speaking with the correct person using two identifiers.   I discussed the limitations of evaluation and management by telemedicine and the availability of in person appointments. The patient expressed understanding and agreed to proceed.  Verbal consent to proceed obtained.  History of Present Illness: Hannah Terry is a 52 y.o. female referred by Dr. Glenard Mire, MD  for consultation & management of history of a colon polyp with a repeat colonoscopy recommended in 7 years back in 2022.  The patient also had an upper endoscopy with H. pylori found at that time.  It appears the patient was treated at that time and a repeat test in July 2022 showed the H. pylori test to be negative.  The patient had a moderate size hiatal hernia at the time of the upper endoscopy. The patient reports that she has been doing well with linzess  145mcg PRN for her constipation but is out of it.  Past Medical History:  Diagnosis Date   Acanthosis nigricans    B12 deficiency    Chronic vaginitis    Multiple sclerosis (HCC)    Lesion on C Spine   Obesity    Vitamin D  deficiency     Past Surgical History:  Procedure Laterality Date   COLONOSCOPY WITH PROPOFOL  N/A 04/23/2021   Procedure: COLONOSCOPY WITH PROPOFOL ;  Surgeon: Terry Rogelia, MD;  [facility];  Service:  Endoscopy;  Laterality: N/A;   ESOPHAGOGASTRODUODENOSCOPY (EGD) WITH PROPOFOL  N/A 04/23/2021   Procedure: ESOPHAGOGASTRODUODENOSCOPY (EGD) WITH PROPOFOL ;  Surgeon: Terry Rogelia, MD;  Location: [facility];  Service: Endoscopy;  Laterality: N/A;   POLYPECTOMY  04/23/2021   Procedure: POLYPECTOMY;  Surgeon: Terry Rogelia, MD;  Location: [facility];  Service: Endoscopy;;    Prior to Admission medications   Medication Sig Start Date End Date Taking? Authorizing Provider  Azelastine -Fluticasone  (DYMISTA ) 137-50 MCG/ACT SUSP Place 1 spray into the nose 2 (two) times daily. 09/06/23   Sowles, Krichna, MD  Cholecalciferol (VITAMIN D ) 2000 units CAPS Take 1 capsule (2,000 Units total) by mouth daily. 08/22/17   Sowles, Krichna, MD  cyclobenzaprine  (FLEXERIL ) 10 MG tablet Take 1 tablet (10 mg total) by mouth at bedtime as needed for muscle spasms. 02/10/21   Sowles, Krichna, MD  hydrOXYzine  (ATARAX /VISTARIL ) 10 MG tablet Take 1 tablet (10 mg total) by mouth 3 (three) times daily as needed. 02/10/21   Sowles, Krichna, MD  lansoprazole  (PREVACID ) 30 MG capsule Take 1 capsule (30 mg total) by mouth 2 (two) times daily before a meal. 05/04/21   Terry Rogelia, MD  levocetirizine (XYZAL ) 5 MG tablet Take 1 tablet (5 mg total) by mouth every evening. 02/07/17   Sowles, Krichna, MD  levonorgestrel (MIRENA) 20 MCG/24HR IUD 1 each by Intrauterine route. 06/22/14   [provider]  linaclotide  (LINZESS ) 145 MCG CAPS capsule Take  1 capsule (145 mcg total) by mouth daily before breakfast. 02/08/23   Jinny Carmine, MD  polyethylene glycol powder (GLYCOLAX /MIRALAX ) 17 GM/SCOOP powder Take 17 g by mouth daily. 07/21/22   Sowles, Krichna, MD  spironolactone  (ALDACTONE ) 25 MG tablet Take 1 tablet (25 mg total) by mouth every morning. 10/03/22   Hester Alm BROCKS, MD    Family History  Problem Relation Age of Onset   Alcohol abuse Mother    Alcohol abuse Father    Drug abuse Father    Heart disease Maternal  Grandfather    Breast cancer Neg Hx      Social History   Tobacco Use   Smoking status: Never   Smokeless tobacco: Former    Types: Snuff, Chew  Vaping Use   Vaping status: Never Used  Substance Use Topics   Alcohol use: No    Alcohol/week: 0.0 standard drinks of alcohol   Drug use: No    Allergies as of 12/19/2023 - Review Complete 10/03/2023  Allergen Reaction Noted   Ozempic  [semaglutide ]  08/17/2018    Review of Systems:    All systems reviewed and negative except where noted in HPI.   Observations/Objective:  Labs: CBC    Component Value Date/Time   WBC 8.9 07/19/2023 1902   RBC 4.55 07/19/2023 1902   HGB 13.6 07/19/2023 1902   HCT 40.8 07/19/2023 1902   PLT 281 07/19/2023 1902   PLT 209 10/02/2013 0741   MCV 89.7 07/19/2023 1902   MCH 29.9 07/19/2023 1902   MCHC 33.3 07/19/2023 1902   RDW 12.3 07/19/2023 1902   LYMPHSABS 3,049 06/08/2023 1546   EOSABS 137 06/08/2023 1546   BASOSABS 64 06/08/2023 1546   CMP     Component Value Date/Time   NA 135 07/19/2023 1902   K 3.3 (L) 07/19/2023 1902   CL 108 07/19/2023 1902   CO2 23 07/19/2023 1902   GLUCOSE 162 (H) 07/19/2023 1902   BUN 12 07/19/2023 1902   CREATININE 0.82 07/19/2023 1902   CREATININE 0.82 06/08/2023 1546   CALCIUM 8.5 (L) 07/19/2023 1902   PROT 7.9 07/19/2023 1902   ALBUMIN 4.0 07/19/2023 1902   ALBUMIN 3.7 10/02/2013 0741   AST 19 07/19/2023 1902   ALT 16 07/19/2023 1902   ALKPHOS 84 07/19/2023 1902   BILITOT 0.7 07/19/2023 1902   GFRNONAA >60 07/19/2023 1902   GFRNONAA 70 02/10/2021 1624   GFRAA 82 02/10/2021 1624    Imaging Studies: US  BREAST ASPIRATION LEFT Result Date: 12/06/2023 CLINICAL DATA:  Cyst with associated pain/tenderness at the left breast 3 o'clock position. EXAM: ULTRASOUND GUIDED LEFT BREAST CYST ASPIRATION COMPARISON:  Previous exam(s). PROCEDURE: The patient and I discussed the procedure of ultrasound-guided aspiration including benefits and alternatives. We  discussed the high likelihood of a successful procedure. We discussed the risks of the procedure including infection, bleeding, tissue injury, and inadequate sampling. Informed written consent was given. The usual time out protocol was performed immediately prior to the procedure. Using sterile technique, 1% lidocaine , under direct ultrasound visualization, needle aspiration of a 2.1 cm cyst at the left breast 3 o'clock position 4 cm from the nipple was performed. A total of 4 mL of clear light brown fluid was aspirated with resulting complete collapse of the cyst. Aspirated fluid has a benign appearance so was discarded. IMPRESSION: Ultrasound-guided aspiration of a cyst at the left breast 3 o'clock position. No apparent complications. RECOMMENDATIONS: Annual screening mammogram, due August 2025. Electronically Signed   By: Leita  Eugune M.D.   On: 12/06/2023 13:41    Assessment and Plan:   Hannah Terry is a 52 y.o. y/o female has been referred for with GERD that does not seem to be a problem now. She was explained about her hiatal hernia and would like to pick up samples of Linzess  for her constipation. The patient will be given samples of Linzess  to be picked p and she will follow up as needed.   Follow Up Instructions:  I discussed the assessment and treatment plan with the patient. The patient was provided an opportunity to ask questions and all were answered. The patient agreed with the plan and demonstrated an understanding of the instructions.   The patient was advised to call back or seek an in-person evaluation if the symptoms worsen or if the condition fails to improve as anticipated.  I provided 15 minutes of non-face-to-face time during this encounter including chart review In preparation for the encounter.   Hannah Copping, MD  Speech recognition software was used to dictate the above note.

## 2024-02-07 ENCOUNTER — Telehealth: Payer: 59 | Admitting: Family Medicine

## 2024-02-07 ENCOUNTER — Encounter: Payer: Self-pay | Admitting: Family Medicine

## 2024-02-07 VITALS — Wt 215.0 lb

## 2024-02-07 DIAGNOSIS — K219 Gastro-esophageal reflux disease without esophagitis: Secondary | ICD-10-CM | POA: Diagnosis not present

## 2024-02-07 DIAGNOSIS — G35 Multiple sclerosis: Secondary | ICD-10-CM | POA: Diagnosis not present

## 2024-02-07 DIAGNOSIS — R0681 Apnea, not elsewhere classified: Secondary | ICD-10-CM

## 2024-02-07 DIAGNOSIS — J301 Allergic rhinitis due to pollen: Secondary | ICD-10-CM

## 2024-02-07 DIAGNOSIS — K5904 Chronic idiopathic constipation: Secondary | ICD-10-CM

## 2024-02-07 MED ORDER — CONTRAVE 8-90 MG PO TB12
ORAL_TABLET | ORAL | 0 refills | Status: AC
Start: 2024-02-07 — End: ?

## 2024-02-07 NOTE — Progress Notes (Signed)
Name: Hannah Terry   MRN: [id number]    DOB: [birth year]   Date:02/07/2024       Progress Note  Subjective  Chief Complaint  Chief Complaint  Patient presents with   Nasal Congestion    Runny nose    Headache    Slight headaches, pt was exposed past Thursday with someone who was later diagnosed with pneumonia.   Weight Loss    Wanting to discuss to start back on ozempic    I connected with  Hannah Terry  on 02/07/24 at  9:40 AM EST by a video enabled telemedicine application and verified that I am speaking with the correct person using two identifiers.  I discussed the limitations of evaluation and management by telemedicine and the availability of in person appointments. The patient expressed understanding and agreed to proceed with the virtual visit  Staff also discussed with the patient that there may be a patient responsible charge related to this service. Patient Location: at work  Provider Location: [facility] Additional Individuals present: alone  Discussed the use of AI scribe software for clinical note transcription with the patient, who gave verbal consent to proceed.  History of Present Illness   Hannah Terry is a 53 year old female who presents with a runny nose and recent exposure to pneumonia and also to discuss chronic medical problems  She has a runny nose and a 'little tickle' in her throat following potential exposure to pneumonia last Thursday. There is no significant nasal congestion or need to blow her nose, and she has not taken any medications for these symptoms except for using Zicam for her nose on Friday. She mentions coughing twice this morning but is unsure if it is related to her current symptoms. No current issues with runny nose, congestion, sneezing, or significant nasal symptoms.  She has seasonal allergies and uses Dymista nasal spray and Xyzal as needed. These medications effectively manage her symptoms, and she reports no current  issues with runny nose, congestion, or sneezing.  She has a history of prediabetes and has previously discussed her weight management. Her weight has fluctuated, with a recent weight of 215 pounds, and she has experienced difficulty losing weight despite working out. She previously lost weight but has since regained some. She has tried various weight loss medications, including Ozempic, which she discontinued due to side effects at higher doses and is not sure if it is covered by insurance at this time.  She has chronic idiopathic constipation and uses Linzess as needed, approximately once every two weeks, to manage her symptoms. She also uses Miralax for constipation but does not use the prescription due to cost. She reports having regular bowel movements with these medications and no issues with straining.  She has a history of gastroesophageal reflux disease (GERD) and takes Prevacid, which controls her symptoms. She notes that losing weight helps reduce her reflux symptoms.  She is under the care of GI  She has a history of multiple sclerosis (MS) with a lesion on her C spine but is not currently experiencing any symptoms or taking medication for MS. She plans to follow up with a neurologist.   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   Obesity (BMI 30.0-34.9) 01/12/2016   MS (multiple sclerosis) (HCC) 01/12/2016   B12 deficiency 12/23/2015   IUD contraception 04/02/2015    Past Surgical  History:  Procedure Laterality Date   COLONOSCOPY WITH PROPOFOL N/A 04/23/2021   Procedure: COLONOSCOPY WITH PROPOFOL;  Surgeon: Midge Minium, MD;  Location: [facility];  Service: Endoscopy;  Laterality: N/A;   ESOPHAGOGASTRODUODENOSCOPY (EGD) WITH PROPOFOL N/A 04/23/2021   Procedure: ESOPHAGOGASTRODUODENOSCOPY (EGD) WITH PROPOFOL;  Surgeon: Midge Minium, MD;  Location: [facility];  Service: Endoscopy;  Laterality: N/A;   POLYPECTOMY  04/23/2021   Procedure: POLYPECTOMY;  Surgeon: Midge Minium, MD;  Location: [facility];  Service: Endoscopy;;    Family History  Problem Relation Age of Onset   Alcohol abuse Mother    Alcohol abuse Father    Drug abuse Father    Heart disease Maternal Grandfather    Breast cancer Neg Hx     Social History   Socioeconomic History   Marital status: Divorced    Spouse name: Not on file   Number of children: 4   Years of education: Not on file   Highest education level: High school graduate  Occupational History   Occupation: Geologist, engineering    Employer: [employer]  Tobacco Use   Smoking status: Never   Smokeless tobacco: Former    Types: Snuff, Designer, multimedia Use   Vaping status: Never Used  Substance and Sexual Activity   Alcohol use: No    Alcohol/week: 0.0 standard drinks of alcohol   Drug use: No   Sexual activity: Not Currently  Other Topics Concern   Not on file  Social History Narrative   ** Merged History Encounter **       She was married for  21 years, has 4 children. Separated since 11/2017, struggling financially since, divorce final 05/20/2019   Social Drivers of Health   Financial Resource Strain: Low Risk  (06/08/2023)   Overall Financial Resource Strain (CARDIA)    Difficulty of Paying Living Expenses: Not hard at all  Food Insecurity: No Food Insecurity (02/22/2023)   Hunger Vital Sign    Worried About Running Out of Food in the Last Year: Never true    Ran Out of Food in the Last  Year: Never true  Transportation Needs: No Transportation Needs (02/22/2023)   PRAPARE - Administrator, Civil Service (Medical): No    Lack of Transportation (Non-Medical): No  Physical Activity: Insufficiently Active (06/08/2023)   Exercise Vital Sign    Days of Exercise per Week: 3 days    Minutes of Exercise per Session: 20 min  Stress: Stress Concern Present (06/08/2023)   [employer] of Occupational Health - Occupational Stress Questionnaire    Feeling of Stress : To some extent  Social Connections: Moderately Integrated (06/08/2023)   Social Connection and Isolation Panel [NHANES]    Frequency of Communication with Friends and Family: More than three times a week    Frequency of Social Gatherings with Friends and Family: Once a week    Attends Religious Services: More than 4 times per year    Active Member of [employer] or Organizations: Yes    Attends Banker Meetings: More than 4 times per year  Marital Status: Divorced  Catering manager Violence: Not At Risk (06/08/2023)   Humiliation, Afraid, Rape, and Kick questionnaire    Fear of Current or Ex-Partner: No    Emotionally Abused: No    Physically Abused: No    Sexually Abused: No     Current Outpatient Medications:    Azelastine-Fluticasone (DYMISTA) 137-50 MCG/ACT SUSP, Place 1 spray into the nose 2 (two) times daily., Disp: 23 g, Rfl: 2   Cholecalciferol (VITAMIN D) 2000 units CAPS, Take 1 capsule (2,000 Units total) by mouth daily., Disp: 30 capsule, Rfl: 0   lansoprazole (PREVACID) 30 MG capsule, Take 1 capsule (30 mg total) by mouth 2 (two) times daily before a meal., Disp: 60 capsule, Rfl: 2   levocetirizine (XYZAL) 5 MG tablet, Take 1 tablet (5 mg total) by mouth every evening., Disp: 30 tablet, Rfl: 2   linaclotide (LINZESS) 145 MCG CAPS capsule, Take 1 capsule (145 mcg total) by mouth daily before breakfast., Disp: 90 capsule, Rfl: 3   Naltrexone-buPROPion HCl ER (CONTRAVE) 8-90 MG TB12,  Start 1 tablet every morning for 7 days, then 1 tablet twice daily for 7 days, then 2 tablets every morning and one every evening, Disp: 120 tablet, Rfl: 0   polyethylene glycol powder (GLYCOLAX/MIRALAX) 17 GM/SCOOP powder, Take 17 g by mouth daily., Disp: 3350 g, Rfl: 1  Allergies  Allergen Reactions   Ozempic [Semaglutide]     headache    I personally reviewed active problem list, medication list, allergies with the patient/caregiver today.   ROS  Ten systems reviewed and is negative except as mentioned in HPI    Objective  Virtual encounter, vitals not obtained.  Body mass index is 35.78 kg/m.  Physical Exam  Awake, alert and oriented   PHQ2/9:    02/07/2024    8:32 AM 10/03/2023    8:59 AM 06/08/2023    3:05 PM 03/06/2023   11:50 AM 07/15/2022   11:50 AM  Depression screen PHQ 2/9  Decreased Interest 0 0 0 0 0  Down, Depressed, Hopeless 0 0 0 0 0  PHQ - 2 Score 0 0 0 0 0  Altered sleeping 0  0 0 0  Tired, decreased energy 0  0 0 0  Change in appetite 0  0 0 0  Feeling bad or failure about yourself  0  0 0 0  Trouble concentrating 0  0 0 0  Moving slowly or fidgety/restless 0  0 0 0  Suicidal thoughts 0  0 0 0  PHQ-9 Score 0  0 0 0  Difficult doing work/chores Not difficult at all   Not difficult at all    PHQ-2/9 Result is negative.    Fall Risk:    02/07/2024    8:32 AM 10/03/2023    8:59 AM 06/08/2023    3:04 PM 03/06/2023   11:50 AM 07/15/2022   11:50 AM  Fall Risk   Falls in the past year? 0 0 0 0 0  Number falls in past yr: 0 0 0 0 0  Injury with Fall? 0 0 0 0 0  Risk for fall due to : No Fall Risks No Fall Risks No Fall Risks  No Fall Risks  Follow up Falls prevention discussed;Education provided;Falls evaluation completed Falls prevention discussed Falls prevention discussed  Falls prevention discussed     Assessment and Plan       Upper Respiratory Symptoms Recent exposure to individual with pneumonia, presenting with mild symptoms including  a  tickle in the throat and slight rhinorrhea. No significant cough or other systemic symptoms. - reassurance given   Gastroesophageal Reflux Disease (GERD) Controlled with Prevacid (Lidoprazole). Noted improvement with weight loss. - Continue Prevacid as prescribed.  Chronic Idiopathic Constipation Managed with Linzess as needed. - Continue Linzess as needed.  Obesity/Morbid - BMI over 35 with co-morbidities such as GERD/history of anxiety Patient has been struggling with weight loss despite efforts. Expressed interest in resuming Ozempic for weight loss, but insurance coverage is an issue. - Prescribe Contrave for weight loss. Patient to obtain coupon online. Follow up in 6 weeks to assess response and side effects.  Multiple Sclerosis (MS) Patient has a known lesion on C-spine but is currently asymptomatic and not on any disease-modifying therapy. - Encourage follow-up with neurologist.  General Health Maintenance - Flu shot administered in September 2024. - Continue over-the-counter Vitamin D supplementation.                    I discussed the assessment and treatment plan with the patient. The patient was provided an opportunity to ask questions and all were answered. The patient agreed with the plan and demonstrated an understanding of the instructions.  The patient was advised to call back or seek an in-person evaluation if the symptoms worsen or if the condition fails to improve as anticipated.  I provided 25  minutes of non-face-to-face time during this encounter.

## 2024-02-14 ENCOUNTER — Telehealth: Payer: 59 | Admitting: Family Medicine

## 2024-02-19 ENCOUNTER — Encounter: Payer: Self-pay | Admitting: Family Medicine

## 2024-03-21 ENCOUNTER — Ambulatory Visit: Payer: 59 | Admitting: Family Medicine

## 2024-05-24 ENCOUNTER — Telehealth: Payer: Self-pay | Admitting: Family Medicine

## 2024-05-24 ENCOUNTER — Other Ambulatory Visit: Payer: Self-pay | Admitting: Family Medicine

## 2024-05-24 DIAGNOSIS — Z87898 Personal history of other specified conditions: Secondary | ICD-10-CM

## 2024-05-24 MED ORDER — SCOPOLAMINE 1 MG/3DAYS TD PT72
1.0000 | MEDICATED_PATCH | TRANSDERMAL | 1 refills | Status: AC
Start: 2024-05-24 — End: ?

## 2024-05-24 NOTE — Telephone Encounter (Signed)
 Pt requesting a refill on her motion sickness patch. She is going out of town this weekend. Please send to [email or web address]. Pt does have upcoming appt for this month

## 2024-06-07 ENCOUNTER — Encounter: Payer: 59 | Admitting: Family Medicine

## 2024-07-30 ENCOUNTER — Other Ambulatory Visit: Payer: Self-pay | Admitting: Family Medicine

## 2024-07-30 DIAGNOSIS — Z1231 Encounter for screening mammogram for malignant neoplasm of breast: Secondary | ICD-10-CM

## 2024-10-11 ENCOUNTER — Ambulatory Visit
